# Patient Record
Sex: Female | Born: 1955
Health system: Southern US, Community
[De-identification: ages and names within clinical notes are randomized; demographics above are authoritative.]

## PROBLEM LIST (undated history)

## (undated) DIAGNOSIS — Z720 Tobacco use: Secondary | ICD-10-CM

## (undated) DIAGNOSIS — F17200 Nicotine dependence, unspecified, uncomplicated: Secondary | ICD-10-CM

## (undated) DIAGNOSIS — I10 Essential (primary) hypertension: Secondary | ICD-10-CM

## (undated) DIAGNOSIS — C44321 Squamous cell carcinoma of skin of nose: Secondary | ICD-10-CM

## (undated) DIAGNOSIS — E559 Vitamin D deficiency, unspecified: Secondary | ICD-10-CM

## (undated) DIAGNOSIS — M79609 Pain in unspecified limb: Secondary | ICD-10-CM

## (undated) DIAGNOSIS — R58 Hemorrhage, not elsewhere classified: Secondary | ICD-10-CM

## (undated) HISTORY — DX: Essential (primary) hypertension: I10

## (undated) HISTORY — DX: Vitamin D deficiency, unspecified: E55.9

## (undated) HISTORY — PX: SQUAMOUS CELL CARCINOMA EXCISION: SHX2433

## (undated) HISTORY — DX: Squamous cell carcinoma of skin of nose: C44.321

## (undated) HISTORY — PX: OTHER SURGICAL HISTORY: SHX169

## (undated) HISTORY — DX: Hemorrhage, not elsewhere classified: R58

## (undated) HISTORY — PX: RHINOPLASTY: SUR1284

## (undated) HISTORY — DX: Tobacco use: Z72.0

## (undated) HISTORY — PX: NASAL RECONSTRUCTION: SHX2069

---
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
## 1998-05-03 ENCOUNTER — Other Ambulatory Visit: Admission: RE | Admit: 1998-05-03 | Discharge: 1998-05-03 | Payer: Self-pay | Admitting: Obstetrics and Gynecology

## 1999-06-06 ENCOUNTER — Other Ambulatory Visit: Admission: RE | Admit: 1999-06-06 | Discharge: 1999-06-06 | Payer: Self-pay | Admitting: Obstetrics and Gynecology

## 1999-06-18 HISTORY — PX: COSMETIC SURGERY: SHX468

## 1999-07-09 ENCOUNTER — Encounter (INDEPENDENT_AMBULATORY_CARE_PROVIDER_SITE_OTHER): Payer: Self-pay | Admitting: Specialist

## 1999-07-09 ENCOUNTER — Other Ambulatory Visit: Admission: RE | Admit: 1999-07-09 | Discharge: 1999-07-09 | Payer: Self-pay | Admitting: Obstetrics and Gynecology

## 2001-01-13 ENCOUNTER — Other Ambulatory Visit: Admission: RE | Admit: 2001-01-13 | Discharge: 2001-01-13 | Payer: Self-pay | Admitting: Obstetrics and Gynecology

## 2001-06-17 DIAGNOSIS — R58 Hemorrhage, not elsewhere classified: Secondary | ICD-10-CM

## 2001-06-17 HISTORY — DX: Hemorrhage, not elsewhere classified: R58

## 2001-06-17 LAB — HM SIGMOIDOSCOPY

## 2002-04-13 ENCOUNTER — Other Ambulatory Visit: Admission: RE | Admit: 2002-04-13 | Discharge: 2002-04-13 | Payer: Self-pay | Admitting: Obstetrics and Gynecology

## 2002-06-23 ENCOUNTER — Encounter: Payer: Self-pay | Admitting: Internal Medicine

## 2002-08-11 ENCOUNTER — Encounter: Payer: Self-pay | Admitting: Internal Medicine

## 2003-06-09 ENCOUNTER — Other Ambulatory Visit: Admission: RE | Admit: 2003-06-09 | Discharge: 2003-06-09 | Payer: Self-pay | Admitting: Obstetrics and Gynecology

## 2004-11-02 ENCOUNTER — Ambulatory Visit: Payer: Self-pay | Admitting: Internal Medicine

## 2004-12-10 ENCOUNTER — Ambulatory Visit: Payer: Self-pay | Admitting: Internal Medicine

## 2005-04-16 ENCOUNTER — Other Ambulatory Visit: Admission: RE | Admit: 2005-04-16 | Discharge: 2005-04-16 | Payer: Self-pay | Admitting: Obstetrics and Gynecology

## 2006-09-19 ENCOUNTER — Encounter: Admission: RE | Admit: 2006-09-19 | Discharge: 2006-09-19 | Payer: Self-pay | Admitting: Obstetrics and Gynecology

## 2007-04-06 ENCOUNTER — Ambulatory Visit: Payer: Self-pay | Admitting: Internal Medicine

## 2007-04-27 ENCOUNTER — Ambulatory Visit: Payer: Self-pay | Admitting: Internal Medicine

## 2007-05-01 ENCOUNTER — Encounter: Admission: RE | Admit: 2007-05-01 | Discharge: 2007-05-01 | Payer: Self-pay | Admitting: Internal Medicine

## 2007-05-04 ENCOUNTER — Ambulatory Visit: Payer: Self-pay | Admitting: Internal Medicine

## 2010-05-03 LAB — HM MAMMOGRAPHY: HM Mammogram: NORMAL

## 2010-05-03 LAB — CONVERTED CEMR LAB: Pap Smear: NORMAL

## 2010-05-08 ENCOUNTER — Ambulatory Visit: Payer: Self-pay | Admitting: Internal Medicine

## 2010-05-08 LAB — CONVERTED CEMR LAB
ALT: 25 units/L (ref 0–35)
AST: 22 units/L (ref 0–37)
Albumin: 4.2 g/dL (ref 3.5–5.2)
Alkaline Phosphatase: 72 units/L (ref 39–117)
BUN: 22 mg/dL (ref 6–23)
Basophils Absolute: 0 10*3/uL (ref 0.0–0.1)
Basophils Relative: 0.7 % (ref 0.0–3.0)
Bilirubin Urine: NEGATIVE
Bilirubin, Direct: 0.1 mg/dL (ref 0.0–0.3)
CO2: 29 meq/L (ref 19–32)
Calcium: 9.6 mg/dL (ref 8.4–10.5)
Chloride: 105 meq/L (ref 96–112)
Cholesterol: 178 mg/dL (ref 0–200)
Creatinine, Ser: 0.7 mg/dL (ref 0.4–1.2)
Eosinophils Absolute: 0.2 10*3/uL (ref 0.0–0.7)
Eosinophils Relative: 2.5 % (ref 0.0–5.0)
GFR calc non Af Amer: 92.08 mL/min (ref >60)
Glucose, Bld: 99 mg/dL (ref 70–99)
HCT: 46.4 % — ABNORMAL HIGH (ref 36.0–46.0)
HDL: 48.6 mg/dL (ref >39.00)
Hemoglobin: 16 g/dL — ABNORMAL HIGH (ref 12.0–15.0)
Ketones, ur: NEGATIVE mg/dL
LDL Cholesterol: 108 mg/dL — ABNORMAL HIGH (ref 0–99)
Leukocytes, UA: NEGATIVE
Lymphocytes Relative: 25.4 % (ref 12.0–46.0)
Lymphs Abs: 1.8 10*3/uL (ref 0.7–4.0)
MCHC: 34.5 g/dL (ref 30.0–36.0)
MCV: 96.4 fL (ref 78.0–100.0)
Monocytes Absolute: 0.5 10*3/uL (ref 0.1–1.0)
Monocytes Relative: 7.3 % (ref 3.0–12.0)
Neutro Abs: 4.5 10*3/uL (ref 1.4–7.7)
Neutrophils Relative %: 64.1 % (ref 43.0–77.0)
Nitrite: NEGATIVE
Platelets: 227 10*3/uL (ref 150.0–400.0)
Potassium: 4.6 meq/L (ref 3.5–5.1)
RBC: 4.81 M/uL (ref 3.87–5.11)
RDW: 12.7 % (ref 11.5–14.6)
Sodium: 142 meq/L (ref 135–145)
Specific Gravity, Urine: 1.025 (ref 1.000–1.030)
TSH: 1.45 microintl units/mL (ref 0.35–5.50)
Total Bilirubin: 0.7 mg/dL (ref 0.3–1.2)
Total CHOL/HDL Ratio: 4
Total Protein, Urine: NEGATIVE mg/dL
Total Protein: 6.9 g/dL (ref 6.0–8.3)
Triglycerides: 107 mg/dL (ref 0.0–149.0)
Urine Glucose: NEGATIVE mg/dL
Urobilinogen, UA: 0.2 (ref 0.0–1.0)
VLDL: 21.4 mg/dL (ref 0.0–40.0)
WBC: 7 10*3/uL (ref 4.5–10.5)
pH: 6 (ref 5.0–8.0)

## 2010-05-16 ENCOUNTER — Telehealth: Payer: Self-pay | Admitting: Internal Medicine

## 2010-05-16 ENCOUNTER — Ambulatory Visit: Payer: Self-pay | Admitting: Internal Medicine

## 2010-06-20 ENCOUNTER — Ambulatory Visit
Admission: RE | Admit: 2010-06-20 | Discharge: 2010-06-20 | Payer: Self-pay | Source: Home / Self Care | Attending: Internal Medicine | Admitting: Internal Medicine

## 2010-06-20 DIAGNOSIS — M79609 Pain in unspecified limb: Secondary | ICD-10-CM | POA: Insufficient documentation

## 2010-06-20 HISTORY — DX: Pain in unspecified limb: M79.609

## 2010-07-03 ENCOUNTER — Telehealth: Payer: Self-pay | Admitting: Internal Medicine

## 2010-07-03 ENCOUNTER — Ambulatory Visit
Admission: RE | Admit: 2010-07-03 | Discharge: 2010-07-03 | Payer: Self-pay | Source: Home / Self Care | Attending: Internal Medicine | Admitting: Internal Medicine

## 2010-07-04 ENCOUNTER — Encounter: Payer: Self-pay | Admitting: Internal Medicine

## 2010-07-04 ENCOUNTER — Telehealth: Payer: Self-pay

## 2010-07-19 NOTE — Assessment & Plan Note (Signed)
Summary: consult re: pain in thumb/?hyper extended/cjr   Vital Signs:  Patient profile:   55 year old female Weight:      127 pounds Pulse rate:   72 / minute BP sitting:   150 / 98  (left arm)  Vitals Entered By: Kyung Rudd, CMA (June 20, 2010 11:04 AM) CC: hurt thumb since before Christmas   CC:  hurt thumb since before Christmas.  History of Present Illness: Patient presents to clinic as a workin for evaluation of thumb pain. Note  ~10d h/o right thumb pain worse with movement. No injury/trauma. No joint swelling, erythema or warmth. Distal thumb does sometime pop into extention. H/o CTS in the past and does note intermittent hand/finger paresthesias. Elevated BP reviewed and states has been elevated in the past. Does not have formal h/o HTN and takes no medication on a regular basis.   Current Medications (verified): 1)  Nabumetone 500 Mg  Tabs (Nabumetone) .... Two Times A Day As Needed  Allergies (verified): 1)  ! Codeine Sulfate (Codeine Sulfate)  Review of Systems      See HPI General:  Denies chills, fever, and sweats. MS:  Complains of joint pain; denies joint redness, joint swelling, loss of strength, and cramps. Neuro:  Complains of numbness and tingling; denies brief paralysis and weakness.  Physical Exam  General:  Well-developed,well-nourished,in no acute distress; alert,appropriate and cooperative throughout examination Head:  Normocephalic and atraumatic without obvious abnormalities. No apparent alopecia or balding. Msk:  normal ROM, no joint swelling, no joint warmth, no redness over joints, and no joint deformities.   No pop/click or joint hesitation. +tenderness of dorsal thumb base. +phalens right without thenar muscle wasting.   Impression & Recommendations:  Problem # 1:  THUMB PAIN, RIGHT (ICD-729.5) Assessment New Attempt nsaid course over 1 week with food and no other nsaid. Has relafen at home and will begin. Discussed xray and pt wishes  to pursue empiric tx first. Followup if no improvement or worsening.  Problem # 2:  CARPAL TUNNEL SYNDROME, RIGHT (ICD-354.0) Assessment: Unchanged Attempt nsaid as above. Followup if no improvement or worsening.  Problem # 3:  ELEVATED BP READING WITHOUT DX HYPERTENSION (ICD-796.2) Assessment: Unchanged Possible contribution from hand/thumb pain. Discussed low sodium diet, regular exercise and recommended outpt blood pressure log to be submitted to PMD>  Complete Medication List: 1)  Nabumetone 500 Mg Tabs (Nabumetone) .... Two times a day as needed   Orders Added: 1)  Est. Patient Level IV [16109]

## 2010-07-19 NOTE — Miscellaneous (Signed)
  Clinical Lists Changes  Orders: Added new Referral order of Orthopedic Referral (Ortho) - Signed

## 2010-07-19 NOTE — Progress Notes (Signed)
----   Converted from flag ---- ---- 07/04/2010 11:30 AM, Edwyna Perfect MD wrote: if thumb pain not any better then can refer ortho ------------------------------  Phone Note Outgoing Call   Call placed by: Kyung Rudd, CMA,  July 04, 2010 11:45 AM Call placed to: Patient Summary of Call: pt would like the referral. referral process started Initial call taken by: Kyung Rudd, CMA,  July 04, 2010 11:46 AM

## 2010-07-19 NOTE — Assessment & Plan Note (Signed)
Summary: VERTIGO/MHF   Vital Signs:  Patient Profile:   55 Years Old Female Weight:      125 pounds Temp:     98.2 degrees F oral Pulse rate:   78 / minute Pulse rhythm:   regular BP sitting:   132 / 94  Vitals Entered By: Lynann Beaver CMA (April 06, 2007 11:58 AM)                 Chief Complaint:  vertigo.  History of Present Illness: 3 weeks of intermittent vertigo. she describes spinning sensation with rapid head turning. She has had similar sxs previously (10 years ago--resolved sponatneously)  Current Allergies: No known allergies   Past Medical History:    Reviewed history from 02/20/2007 and no changes required:       L4-L5       hemorrhoidal bleeding  ~2003  Past Surgical History:    Reviewed history from 02/20/2007 and no changes required:       C-section       cosmetic surgery (eyes)     Review of Systems       no other complaints in a complete ROS    Physical Exam  General:     Well-developed,well-nourished,in no acute distress; alert,appropriate and cooperative throughout examination Head:     normocephalic and atraumatic.   Eyes:     pupils equal, pupils round, and no nystagmus.   eom intact Neck:     No deformities, masses, or tenderness noted. Chest Wall:     No deformities, masses, or tenderness noted. Lungs:     Normal respiratory effort, chest expands symmetrically. Lungs are clear to auscultation, no crackles or wheezes.    Impression & Recommendations:  Problem # 1:  VERTIGO (ICD-780.4) no concerning findings---she will call if sxs persist Her updated medication list for this problem includes:    Meclizine Hcl 25 Mg Tabs (Meclizine hcl) .Marland Kitchen... 1 by mouth at bedtime as needed   Complete Medication List: 1)  Nabumetone 500 Mg Tabs (Nabumetone) .... 2 at bedtime prn 2)  Meclizine Hcl 25 Mg Tabs (Meclizine hcl) .Marland Kitchen.. 1 by mouth at bedtime as needed      Prescriptions: MECLIZINE HCL 25 MG TABS (MECLIZINE HCL) 1 by  mouth at bedtime as needed  #20 x 1   Entered and Authorized by:   Birdie Sons MD   Signed by:   Birdie Sons MD on 04/06/2007   Method used:   Electronically sent to ...       [id number]       [phone]       [place], [place]  [address]       Ph: [phone]       Fax: [phone]   RxID:   [id number]  ]

## 2010-07-19 NOTE — Letter (Signed)
Summary: [facility]  [facility]   Imported By: Lanelle Bal 06/08/2010 10:08:50  _____________________________________________________________________  External Attachment:    Type:   Image     Comment:   External Document

## 2010-07-19 NOTE — Progress Notes (Signed)
Summary: xray please  Phone Note Call from Patient Call back at Home Phone [phone]   Caller: Patient Call For: Birdie Sons MD Summary of Call: pt is still having thumb pain she is requesting xray Initial call taken by: Heron Sabins,  July 03, 2010 9:27 AM  Follow-up for Phone Call        xray order placed for right hand and thumb Follow-up by: Edwyna Perfect MD,  July 03, 2010 9:36 AM  Additional Follow-up for Phone Call Additional follow up Details #1::        pt aware Additional Follow-up by: Kyung Rudd, CMA,  July 03, 2010 12:35 PM  New Problems: HAND PAIN, RIGHT (ICD-729.5) HAND PAIN, LEFT (ICD-729.5) THUMB PAIN, RIGHT (ICD-729.5)   New Problems: HAND PAIN, RIGHT (ICD-729.5) HAND PAIN, LEFT (ICD-729.5) THUMB PAIN, RIGHT (ICD-729.5)

## 2010-07-19 NOTE — Assessment & Plan Note (Signed)
Summary: CPX // RS   Vital Signs:  Patient profile:   55 year old female Height:      60 inches Weight:      125 pounds BMI:     24.50 Temp:     98.1 degrees F oral Pulse rate:   72 / minute Pulse rhythm:   regular BP sitting:   138 / 94  (left arm) Cuff size:   regular  Vitals Entered By: Alfred Levins, CMA (May 16, 2010 9:01 AM)  Serial Vital Signs/Assessments:  Time      Position  BP       Pulse  Resp  Temp     By                     128/85                         Birdie Sons MD  CC: cpx, no pap   CC:  cpx and no pap.  History of Present Illness: CPX  in addition she has multiple complaints--- Hip pain---seems to be more right sided---improving with new mattress. Pain typically worse after (not during) walking.  Smoker---she is interested in quitting but not taking any medications  BP--she is concerned about it---has a BP cuff but never checks.   Menopausal sxs---has regular f/u with GYN.  All other systems reviewed and were negative   Current Problems (verified): None  Current Medications (verified): 1)  Nabumetone 500 Mg  Tabs (Nabumetone) .... 2 At Bedtime Prn  Allergies (verified): 1)  ! 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 HEENT exam atraumatic, normocephalic symmetric her muscles are intact. Neck is supple without lymphadenopathy, thyromegaly, jugular venous distention. Chest her auscultation. Cardiac exam S1-S2 are regular. Abdominal exam active bowel sounds, soft and nontender. Extremities no clubbing cyanosis or edema. Neurologic exam she is alert and oriented without any motor or sensory deficits. Gait is normal.   Impression &  Recommendations:  Problem # 1:  HEALTH SCREENING (ICD-V70.0) health maintenance is up-to-date. I'll give her colonoscopy report from gastroenterology. She's up-to-date on other health maintenance issues. Discussed smoking cessation. She's not interested in medications. I've advised her to taper cigarettes. In regards to her complaints. I think she has a persistence of her previously diagnosed lumbar disc problem. I think her hip discomfort is more back related. I'll give her a pamphlet on back exercises.   Complete Medication List: 1)  Nabumetone 500 Mg Tabs (Nabumetone) .... 2 at bedtime prn  Preventive Care Screening  Colonoscopy:    Date:  06/17/2005    Next Due:  06/2015    Results:  normal   Mammogram:    Date:  05/03/2010    Next Due:  05/2012    Results:  normal   Pap Smear:    Date:  05/03/2010    Next Due:  05/2013    Results:  normal     Orders Added: 1)  Est. Patient 40-64 years [99396]    Preventive Care Screening  Colonoscopy:    Date:  06/17/2005    Next Due:  06/2015    Results:  normal   Mammogram:    Date:  05/03/2010    Next Due:  05/2012    Results:  normal   Pap Smear:    Date:  05/03/2010    Next Due:  05/2013    Results:  normal

## 2010-07-19 NOTE — Assessment & Plan Note (Signed)
Summary: STILL HAS VERTIGO/CCM   Vital Signs:  Patient Profile:   55 Years Old Female Weight:      126 pounds Temp:     98.5 degrees F oral Pulse rate:   72 / minute BP sitting:   128 / 84  (left arm)  Vitals Entered By: Gladis Riffle, RN (April 27, 2007 4:34 PM)                 Chief Complaint:  c/o vertigo--improved with Meclizine and but symptoms returned when turned on light.  History of Present Illness: persistent vertigo meclizine no real relief no other symptoms  Current Allergies: ! CODEINE SULFATE (CODEINE SULFATE)  Past Medical History:    Reviewed history from 02/20/2007 and no changes required:       L4-L5       hemorrhoidal bleeding  ~2003  Past Surgical History:    Reviewed history from 02/20/2007 and no changes required:       C-section       cosmetic surgery (eyes)     Review of Systems       no other complaints in a complete ROS    Physical Exam  General:     Well-developed,well-nourished,in no acute distress; alert,appropriate and cooperative throughout examination Head:     normocephalic and atraumatic.   Eyes:     pupils equal, pupils round, and no nystagmus.   Ears:     R ear normal, L ear normal, and no external deformities.   Nose:     no external deformity.   Neck:     No deformities, masses, or tenderness noted. Abdomen:     Bowel sounds positive,abdomen soft and non-tender without masses, organomegaly or hernias noted.    Impression & Recommendations:  Problem # 1:  VERTIGO (ICD-780.4) persistent vertigo---worsening mri brain Her updated medication list for this problem includes:    Meclizine Hcl 25 Mg Tabs (Meclizine hcl) ..... One at bedtime as needed  Orders: Radiology Referral (Radiology)   Complete Medication List: 1)  Nabumetone 500 Mg Tabs (Nabumetone) .... 2 at bedtime prn 2)  Meclizine Hcl 25 Mg Tabs (Meclizine hcl) .... One at bedtime as needed     ]

## 2010-07-19 NOTE — Letter (Signed)
Summary: [facility]  [facility]   Imported By: Lanelle Bal 06/08/2010 10:11:35  _____________________________________________________________________  External Attachment:    Type:   Image     Comment:   External Document

## 2010-07-19 NOTE — Letter (Signed)
[facility]   Imported By: Maryln Gottron 05/31/2010 13:03:40  _____________________________________________________________________  External Attachment:    Type:   Image     Comment:   External Document

## 2010-07-19 NOTE — Progress Notes (Signed)
Summary: clairfy directions  Phone Note From Pharmacy   Caller: [facility]. #[id number]* Call For: Floye Fesler  Summary of Call: rx sent in electronically directions say 2 by mouth at bedtime and two times a day as needed for back pain.  They want to clarify the directions Initial call taken by: Alfred Levins, CMA,  May 16, 2010 5:08 PM  Follow-up for Phone Call        my mistake...change to two times a day as needed   please change in med list Follow-up by: Birdie Sons MD,  May 16, 2010 5:14 PM  Additional Follow-up for Phone Call Additional follow up Details #1::        Phone call completed, Pharmacist called Additional Follow-up by: Alfred Levins, CMA,  May 16, 2010 5:17 PM    New/Updated Medications: NABUMETONE 500 MG  TABS (NABUMETONE) two times a day as needed Prescriptions: NABUMETONE 500 MG  TABS (NABUMETONE) two times a day as needed  #30 x 3   Entered by:   Alfred Levins, CMA   Authorized by:   Birdie Sons MD   Signed by:   Alfred Levins, CMA on 05/16/2010   Method used:   Electronically to        [facility]. [phone]* (retail)       [phone]       [place], [place]  [address]       Ph: [phone]       Fax: [phone]   RxID:   [id number]

## 2010-07-19 NOTE — Assessment & Plan Note (Signed)
Summary: DISCUSS MRI RESULTS/DM   Vital Signs:  Patient Profile:   55 Years Old Female Pulse rate:   84 / minute BP sitting:   158 / 100  (left arm)                 Serial Vital Signs/Assessments:  Time      Position  BP       Pulse  Resp  Temp     By                     110/60                         Birdie Sons MD   Chief Complaint:  discuss MRI results--declines flu shot.  Current Allergies (reviewed today): ! CODEINE SULFATE (CODEINE SULFATE)        Impression & Recommendations:  Problem # 1:  MAGNETIC RESONANCE IMAGING, BRAIN, ABNORMAL (ICD-794.09) Discussed the results at length. It is not clear at all to me that her sxs of vertigo are related to the findings on MRI. Reviewed MRI at length. She understands the possibility of MS. I've told her she needs to see a specialist and she agrees.  Orders: Neurology Referral (Neuro) total time 25 minutes---all discussion  Complete Medication List: 1)  Nabumetone 500 Mg Tabs (Nabumetone) .... 2 at bedtime prn 2)  Meclizine Hcl 25 Mg Tabs (Meclizine hcl) .... One at bedtime as needed     ]

## 2011-03-12 ENCOUNTER — Ambulatory Visit (INDEPENDENT_AMBULATORY_CARE_PROVIDER_SITE_OTHER): Payer: BC Managed Care – PPO | Admitting: Internal Medicine

## 2011-03-12 ENCOUNTER — Encounter: Payer: Self-pay | Admitting: Internal Medicine

## 2011-03-12 VITALS — BP 130/84 | Temp 98.2°F | Wt 126.0 lb

## 2011-03-12 DIAGNOSIS — M79609 Pain in unspecified limb: Secondary | ICD-10-CM

## 2011-03-12 NOTE — Progress Notes (Signed)
  Subjective:    Patient ID: Amanda Moss, female    DOB: 25-Jul-1955, 55 y.o.   MRN: [id number]  HPI  Bilateral wrist pain--base of thumbs Symptoms of ongoing for several months. See previous x-ray results. She continues to have pain. Now she has left thumb discomfort as well. Some of the pain radiates to the wrist and both hands. She denies any redness or effusions.  Past Medical History  Diagnosis Date  . Bleeding 2003    hemorrhoidal bleeding   Past Surgical History  Procedure Date  . Cesarean section   . Cosmectic surgery     eyes  . Rhinoplasty     reports that she has been smoking Cigarettes.  She has been smoking about .5 packs per day. She does not have any smokeless tobacco history on file. She reports that she drinks alcohol. She reports that she does not use illicit drugs. family history includes Cancer (age of onset:62) in her father; Hypertension in her father; and Pneumonia (age of onset:76) in her mother.  There is no history of Diabetes. Allergies  Allergen Reactions  . Codeine Sulfate     REACTION: nausea     Review of Systems    patient denies chest pain, shortness of breath, orthopnea. Denies lower extremity edema, abdominal pain, change in appetite, change in bowel movements. Patient denies rashes, musculoskeletal complaints. No other specific complaints in a complete review of systems.    Objective:   Physical Exam Well-developed female in no acute distress. Neck is supple. Full range of motion of shoulders, elbows, wrists. No effusions noted.       Assessment & Plan:

## 2011-03-12 NOTE — Assessment & Plan Note (Signed)
Given her duration of discomfort I think she needs further evaluation. We'll check laboratory work. She may need to go see a Hydrographic surveyor.

## 2011-03-13 LAB — C-REACTIVE PROTEIN: CRP: 0.14 mg/dL (ref <0.60)

## 2011-03-13 LAB — CYCLIC CITRUL PEPTIDE ANTIBODY, IGG: Cyclic Citrullin Peptide Ab: <2 U/mL (ref 0.0–5.0)

## 2011-03-13 LAB — RHEUMATOID FACTOR: Rhuematoid fact SerPl-aCnc: <10 IU/mL (ref <=14)

## 2011-03-14 ENCOUNTER — Telehealth: Payer: Self-pay | Admitting: *Deleted

## 2011-03-14 DIAGNOSIS — M79641 Pain in right hand: Secondary | ICD-10-CM

## 2011-03-14 NOTE — Telephone Encounter (Signed)
Per Dr Cato Mulligan refer to Dr Teressa Senter for evaluation.  Referral order placed and pt aware

## 2011-03-14 NOTE — Telephone Encounter (Signed)
Gave pt normal lab results, she is still having wrist pain and wants to know the next step

## 2013-07-18 LAB — HM MAMMOGRAPHY

## 2014-01-31 ENCOUNTER — Telehealth: Payer: Self-pay | Admitting: Internal Medicine

## 2014-01-31 NOTE — Telephone Encounter (Signed)
Ok to schedule.

## 2014-01-31 NOTE — Telephone Encounter (Signed)
Pt last saw dr sword 02-2011. Pt needs biometric screening done before 03-16-14. Can I create 30 min slot ?

## 2014-02-01 NOTE — Telephone Encounter (Signed)
Pt has been sch

## 2014-03-02 ENCOUNTER — Ambulatory Visit (INDEPENDENT_AMBULATORY_CARE_PROVIDER_SITE_OTHER): Payer: BC Managed Care – PPO | Admitting: Family

## 2014-03-02 ENCOUNTER — Encounter: Payer: Self-pay | Admitting: Family

## 2014-03-02 VITALS — BP 160/90 | HR 67 | Ht 60.0 in | Wt 134.3 lb

## 2014-03-02 DIAGNOSIS — IMO0001 Reserved for inherently not codable concepts without codable children: Secondary | ICD-10-CM

## 2014-03-02 DIAGNOSIS — Z0000 Encounter for general adult medical examination without abnormal findings: Secondary | ICD-10-CM

## 2014-03-02 DIAGNOSIS — R030 Elevated blood-pressure reading, without diagnosis of hypertension: Secondary | ICD-10-CM

## 2014-03-02 DIAGNOSIS — Z23 Encounter for immunization: Secondary | ICD-10-CM

## 2014-03-02 LAB — COMPREHENSIVE METABOLIC PANEL
ALT: 22 U/L (ref 0–35)
AST: 21 U/L (ref 0–37)
Albumin: 4.3 g/dL (ref 3.5–5.2)
Alkaline Phosphatase: 91 U/L (ref 39–117)
BUN: 19 mg/dL (ref 6–23)
CO2: 27 mEq/L (ref 19–32)
Calcium: 9.6 mg/dL (ref 8.4–10.5)
Chloride: 107 mEq/L (ref 96–112)
Creatinine, Ser: 0.8 mg/dL (ref 0.4–1.2)
GFR: 76.77 mL/min (ref >60.00)
Glucose, Bld: 111 mg/dL — ABNORMAL HIGH (ref 70–99)
Potassium: 4.8 mEq/L (ref 3.5–5.1)
Sodium: 141 mEq/L (ref 135–145)
Total Bilirubin: 0.5 mg/dL (ref 0.2–1.2)
Total Protein: 7.6 g/dL (ref 6.0–8.3)

## 2014-03-02 LAB — CBC WITH DIFFERENTIAL/PLATELET
Basophils Absolute: 0 10*3/uL (ref 0.0–0.1)
Basophils Relative: 0.5 % (ref 0.0–3.0)
Eosinophils Absolute: 0.2 10*3/uL (ref 0.0–0.7)
Eosinophils Relative: 2.4 % (ref 0.0–5.0)
HCT: 47.2 % — ABNORMAL HIGH (ref 36.0–46.0)
Hemoglobin: 15.8 g/dL — ABNORMAL HIGH (ref 12.0–15.0)
Lymphocytes Relative: 22.3 % (ref 12.0–46.0)
Lymphs Abs: 1.5 10*3/uL (ref 0.7–4.0)
MCHC: 33.5 g/dL (ref 30.0–36.0)
MCV: 96 fl (ref 78.0–100.0)
Monocytes Absolute: 0.4 10*3/uL (ref 0.1–1.0)
Monocytes Relative: 6.5 % (ref 3.0–12.0)
Neutro Abs: 4.7 10*3/uL (ref 1.4–7.7)
Neutrophils Relative %: 68.3 % (ref 43.0–77.0)
Platelets: 240 10*3/uL (ref 150.0–400.0)
RBC: 4.92 Mil/uL (ref 3.87–5.11)
RDW: 13.3 % (ref 11.5–15.5)
WBC: 6.9 10*3/uL (ref 4.0–10.5)

## 2014-03-02 LAB — POCT URINALYSIS DIPSTICK
Bilirubin, UA: NEGATIVE
Glucose, UA: NEGATIVE
Ketones, UA: NEGATIVE
Leukocytes, UA: NEGATIVE
Nitrite, UA: NEGATIVE
Protein, UA: NEGATIVE
Spec Grav, UA: 1.025
Urobilinogen, UA: 0.2
pH, UA: 5.5

## 2014-03-02 LAB — LIPID PANEL
Cholesterol: 196 mg/dL (ref 0–200)
HDL: 48.9 mg/dL (ref >39.00)
LDL Cholesterol: 119 mg/dL — ABNORMAL HIGH (ref 0–99)
NonHDL: 147.1
Total CHOL/HDL Ratio: 4
Triglycerides: 140 mg/dL (ref 0.0–149.0)
VLDL: 28 mg/dL (ref 0.0–40.0)

## 2014-03-02 LAB — TSH: TSH: 0.52 u[IU]/mL (ref 0.35–4.50)

## 2014-03-02 NOTE — Progress Notes (Signed)
Pre visit review using our clinic review tool, if applicable. No additional management support is needed unless otherwise documented below in the visit note. 

## 2014-03-02 NOTE — Patient Instructions (Signed)

## 2014-03-02 NOTE — Progress Notes (Signed)
Subjective:    Patient ID: Amanda Moss, female    DOB: 1955-11-26, 58 y.o.   MRN: [id number]  HPI 58 year old white female, nonsmoker, is in today for a CPX.  This is a routine wellness  examination for this patient . I reviewed all health maintenance protocols including mammography, colonoscopy, bone density Needed referrals were placed. Age and diagnosis  appropriate screening labs were ordered. Her immunization history was reviewed and appropriate vaccinations were ordered. Her current medications and allergies were reviewed and needed refills of her chronic medications were ordered. The plan for yearly health maintenance was discussed all orders and referrals were made as appropriate.   Review of Systems  Constitutional: Negative.   HENT: Negative.   Eyes: Negative.   Respiratory: Negative.   Cardiovascular: Negative.   Gastrointestinal: Negative.   Endocrine: Negative.   Genitourinary: Negative.   Musculoskeletal: Negative.   Skin: Negative.   Allergic/Immunologic: Negative.   Neurological: Negative.   Hematological: Negative.   Psychiatric/Behavioral: Negative.    Past Medical History  Diagnosis Date  . Bleeding 2003    hemorrhoidal bleeding    History   Social History  . Marital Status: Divorced    Spouse Name: N/A    Number of Children: 1  . Years of Education: N/A   Occupational History  . Corporate treasurer    Social History Main Topics  . Smoking status: Current Every Day Smoker -- 0.50 packs/day    Types: Cigarettes  . Smokeless tobacco: Not on file  . Alcohol Use: Yes  . Drug Use: No  . Sexual Activity: Not on file   Other Topics Concern  . Not on file   Social History Narrative  . No narrative on file    Past Surgical History  Procedure Laterality Date  . Cesarean section    . Cosmectic surgery      eyes  . Rhinoplasty      Family History  Problem Relation Age of Onset  . Pneumonia Mother 57  . Cancer Father 25    lung  .  Hypertension Father   . Diabetes Neg Hx     Allergies  Allergen Reactions  . Codeine Sulfate     REACTION: nausea    No current outpatient prescriptions on file prior to visit.   No current facility-administered medications on file prior to visit.    BP 160/90  Pulse 67  Ht 5' (1.524 m)  Wt 134 lb 4.8 oz (60.918 kg)  BMI 26.23 kg/m2chart    Objective:   Physical Exam  Constitutional: She is oriented to person, place, and time. She appears well-developed and well-nourished.  HENT:  Head: Normocephalic.  Right Ear: External ear normal.  Left Ear: External ear normal.  Nose: Nose normal.  Mouth/Throat: Oropharynx is clear and moist.  Eyes: Conjunctivae and EOM are normal. Pupils are equal, round, and reactive to light.  Neck: Normal range of motion. Neck supple. No thyromegaly present.  Cardiovascular: Normal rate, regular rhythm and normal heart sounds.   Pulmonary/Chest: Effort normal and breath sounds normal.  Abdominal: Soft. Bowel sounds are normal.  Musculoskeletal: Normal range of motion.  Neurological: She is alert and oriented to person, place, and time. She has normal reflexes.  Skin: Skin is warm and dry.  Psychiatric: She has a normal mood and affect.          Assessment & Plan:  Nettie was seen today for annual exam.  Diagnoses and associated orders for  this visit:  Preventative health care - POCT urinalysis dipstick - CBC with Differential - TSH - Lipid Panel - CMP - Ambulatory referral to Gastroenterology - EKG 12-Lead  Elevated blood pressure - POCT urinalysis dipstick - CBC with Differential - TSH - Lipid Panel - CMP - EKG 12-Lead  Need for prophylactic vaccination with combined diphtheria-tetanus-pertussis (DTP) vaccine - Tdap vaccine greater than or equal to 7yo IM   Call the office with any questions or concerns. Recheck as scheduled in 1 month and as needed. Recheck blood pressure x 2 weeks and send blood pressure readings through  mychart.

## 2014-03-09 ENCOUNTER — Ambulatory Visit: Payer: BC Managed Care – PPO | Admitting: Family

## 2014-04-04 ENCOUNTER — Encounter: Payer: Self-pay | Admitting: Family

## 2014-07-14 ENCOUNTER — Telehealth: Payer: Self-pay | Admitting: Internal Medicine

## 2014-07-14 NOTE — Telephone Encounter (Signed)
Patient Name: Amanda Moss  DOB: April 06, 1956    Initial Comment Caller states she was seen Sept/Oct for high blood pressure. Quit drinking, got healthier, still 154/90.   Nurse Assessment  Nurse: Raphael Gibney, RN, Vanita Ingles Date/Time Eilene Ghazi Time): 07/14/2014 12:31:54 PM  Confirm and document reason for call. If symptomatic, describe symptoms. ---Caller states she saw doctor back in Sept/Oct for check up and her BP was high. She is taking exercise and has lost exercise. BP 154/90. She is not taking any BP medication.  Has the patient traveled out of the country within the last 30 days? ---Not Applicable  Does the patient require triage? ---Yes  Related visit to physician within the last 2 weeks? ---No  Does the PT have any chronic conditions? (i.e. diabetes, asthma, etc.) ---No     Guidelines    Guideline Title Affirmed Question Affirmed Notes  High Blood Pressure [1] BP ? 140/90 AND [2] not taking BP medications    Final Disposition User   See PCP within 2 Maudry Diego, Therapist, sports, Vanita Ingles

## 2014-07-19 ENCOUNTER — Encounter: Payer: Self-pay | Admitting: Family Medicine

## 2014-07-19 ENCOUNTER — Ambulatory Visit (INDEPENDENT_AMBULATORY_CARE_PROVIDER_SITE_OTHER): Payer: BLUE CROSS/BLUE SHIELD | Admitting: Family Medicine

## 2014-07-19 VITALS — BP 158/90 | HR 81 | Temp 97.6°F | Ht 60.0 in | Wt 132.6 lb

## 2014-07-19 DIAGNOSIS — I10 Essential (primary) hypertension: Secondary | ICD-10-CM

## 2014-07-19 DIAGNOSIS — F17200 Nicotine dependence, unspecified, uncomplicated: Secondary | ICD-10-CM

## 2014-07-19 DIAGNOSIS — Z720 Tobacco use: Secondary | ICD-10-CM

## 2014-07-19 MED ORDER — AMLODIPINE BESYLATE 5 MG PO TABS: 5.0000 mg | ORAL_TABLET | Freq: Every day | ORAL | Status: DC

## 2014-07-19 NOTE — Progress Notes (Signed)
Pre visit review using our clinic review tool, if applicable. No additional management support is needed unless otherwise documented below in the visit note. 

## 2014-07-19 NOTE — Progress Notes (Signed)
  HPI:  Amanda Moss is a 59 yo female prior patient of Dr. Leanne Moss and Amanda Moss here for an acute visit for:  HTN: -hx elevated BP for several years at physicals with gyn and watches it at home -not on medications in the past -more elevated the last year -smoker -watching carbs and exercising recently -denies: CP, vision loss, HA, SOB -FH HTN   ROS: See pertinent positives and negatives per HPI.  Past Medical History  Diagnosis Date  . Bleeding 2003    hemorrhoidal bleeding    Past Surgical History  Procedure Laterality Date  . Cesarean section    . Cosmectic surgery      eyes  . Rhinoplasty      Family History  Problem Relation Age of Onset  . Pneumonia Mother 63  . Cancer Father 23    lung  . Hypertension Father   . Diabetes Neg Hx     History   Social History  . Marital Status: Divorced    Spouse Name: N/A    Number of Children: 1  . Years of Education: N/A   Occupational History  . Corporate treasurer    Social History Main Topics  . Smoking status: Current Every Day Smoker -- 0.50 packs/day    Types: Cigarettes  . Smokeless tobacco: None  . Alcohol Use: Yes  . Drug Use: No  . Sexual Activity: None   Other Topics Concern  . None   Social History Narrative     Current outpatient prescriptions:  .  amLODipine (NORVASC) 5 MG tablet, Take 1 tablet (5 mg total) by mouth daily., Disp: 30 tablet, Rfl: 1  EXAM:  Filed Vitals:   07/19/14 1042  BP: 158/90  Pulse: 81  Temp: 97.6 F (36.4 C)    Body mass index is 25.9 kg/(m^2).  GENERAL: vitals reviewed and listed above, alert, oriented, appears well hydrated and in no acute distress  HEENT: atraumatic, conjunttiva clear, no obvious abnormalities on inspection of external nose and ears  NECK: no obvious masses on inspection  LUNGS: clear to auscultation bilaterally, no wheezes, rales or rhonchi, good air movement  CV: HRRR, no peripheral edema  MS: moves all extremities without  noticeable abnormality  PSYCH: pleasant and cooperative, no obvious depression or anxiety  ASSESSMENT AND PLAN:  Discussed the following assessment and plan:  Essential hypertension - Plan: amLODipine (NORVASC) 5 MG tablet  Tobacco use disorder  -discussed options for HTN treatment - she opted for norvasc -advised needs PCP and to set this up -follow up with me in  1 month if does not have PCP -Patient advised to return or notify a doctor immediately if symptoms worsen or persist or new concerns arise.  Patient Instructions  BEFORE YOU LEAVE: -schedule follow up in 1 month  Start norvasc 5mg  daily  You need a new primary care doctor - please establish care with a new primary care doctor  Please quit smoking  We recommend the following healthy lifestyle measures: - eat a healthy diet consisting of lots of vegetables, fruits, beans, nuts, seeds, healthy meats such as white chicken and fish and whole grains.  - avoid fried foods, fast food, processed foods, sodas, red meet and other fattening foods.  - get a least 150 minutes of aerobic exercise per week.         Amanda Moss R.

## 2014-07-19 NOTE — Patient Instructions (Signed)
BEFORE YOU LEAVE: -schedule follow up in 1 month  Start norvasc 5mg  daily  You need a new primary care doctor - please establish care with a new primary care doctor  Please quit smoking  We recommend the following healthy lifestyle measures: - eat a healthy diet consisting of lots of vegetables, fruits, beans, nuts, seeds, healthy meats such as white chicken and fish and whole grains.  - avoid fried foods, fast food, processed foods, sodas, red meet and other fattening foods.  - get a least 150 minutes of aerobic exercise per week.

## 2014-07-20 ENCOUNTER — Telehealth: Payer: Self-pay | Admitting: Internal Medicine

## 2014-07-20 NOTE — Telephone Encounter (Signed)
emmi emailed °

## 2014-07-26 NOTE — Telephone Encounter (Signed)
Pt has been scheduled for 09/2014

## 2014-08-17 ENCOUNTER — Encounter: Payer: Self-pay | Admitting: Family Medicine

## 2014-08-17 ENCOUNTER — Ambulatory Visit (INDEPENDENT_AMBULATORY_CARE_PROVIDER_SITE_OTHER): Payer: BLUE CROSS/BLUE SHIELD | Admitting: Family Medicine

## 2014-08-17 ENCOUNTER — Telehealth: Payer: Self-pay | Admitting: Internal Medicine

## 2014-08-17 VITALS — BP 132/88 | HR 84 | Temp 97.7°F | Ht 60.0 in | Wt 129.2 lb

## 2014-08-17 DIAGNOSIS — E785 Hyperlipidemia, unspecified: Secondary | ICD-10-CM

## 2014-08-17 DIAGNOSIS — Z720 Tobacco use: Secondary | ICD-10-CM

## 2014-08-17 DIAGNOSIS — R739 Hyperglycemia, unspecified: Secondary | ICD-10-CM

## 2014-08-17 DIAGNOSIS — F17200 Nicotine dependence, unspecified, uncomplicated: Secondary | ICD-10-CM

## 2014-08-17 DIAGNOSIS — I10 Essential (primary) hypertension: Secondary | ICD-10-CM

## 2014-08-17 DIAGNOSIS — Z716 Tobacco abuse counseling: Secondary | ICD-10-CM

## 2014-08-17 MED ORDER — BUPROPION HCL ER (SR) 150 MG PO TB12: 150.0000 mg | ORAL_TABLET | Freq: Every day | ORAL | Status: DC

## 2014-08-17 NOTE — Telephone Encounter (Signed)
PA for bupropion was denied.  Patient's plan does not approve bupropion for tobacco disorder.

## 2014-08-17 NOTE — Progress Notes (Signed)
Pre visit review using our clinic review tool, if applicable. No additional management support is needed unless otherwise documented below in the visit note. 

## 2014-08-17 NOTE — Progress Notes (Signed)
HPI:  Amanda Moss is a 59 yo female prior patient of Dr. Leanne Chang and Roxy Cedar here for f/u HTN:  HTN: -hx elevated BP for several years at physicals with gyn and watches it at home -not on medications in the past, started norvasc 5 mg 07/19/14 -smoker - advised to quit -watching carbs and exercising recently -denies: CP, vision loss, HA, SOB -FH HTN  Smoker: -she did chantix in the past and did not tolerate -she smokes 1/2 ppd but has cut back the last few days -she like the physical part of smoking -denies COPD, cough, weight loss  ROS: See pertinent positives and negatives per HPI.  Past Medical History  Diagnosis Date  . Bleeding 2003    hemorrhoidal bleeding    Past Surgical History  Procedure Laterality Date  . Cesarean section    . Cosmectic surgery      eyes  . Rhinoplasty      Family History  Problem Relation Age of Onset  . Pneumonia Mother 15  . Cancer Father 36    lung  . Hypertension Father   . Diabetes Neg Hx     History   Social History  . Marital Status: Divorced    Spouse Name: N/A  . Number of Children: 1  . Years of Education: N/A   Occupational History  . Corporate treasurer    Social History Main Topics  . Smoking status: Current Every Day Smoker -- 0.50 packs/day    Types: Cigarettes  . Smokeless tobacco: Not on file  . Alcohol Use: Yes  . Drug Use: No  . Sexual Activity: Not on file   Other Topics Concern  . None   Social History Narrative     Current outpatient prescriptions:  .  amLODipine (NORVASC) 5 MG tablet, Take 1 tablet (5 mg total) by mouth daily., Disp: 30 tablet, Rfl: 1 .  buPROPion (WELLBUTRIN SR) 150 MG 12 hr tablet, Take 1 tablet (150 mg total) by mouth daily., Disp: 90 tablet, Rfl: 0  EXAM:  Filed Vitals:   08/17/14 0853  BP: 132/88  Pulse: 84  Temp: 97.7 F (36.5 C)    Body mass index is 25.23 kg/(m^2).  GENERAL: vitals reviewed and listed above, alert, oriented, appears well hydrated and  in no acute distress  HEENT: atraumatic, conjunttiva clear, no obvious abnormalities on inspection of external nose and ears  NECK: no obvious masses on inspection  LUNGS: clear to auscultation bilaterally, no wheezes, rales or rhonchi, good air movement  CV: HRRR, no peripheral edema  MS: moves all extremities without noticeable abnormality  PSYCH: pleasant and cooperative, no obvious depression or anxiety  ASSESSMENT AND PLAN:  Discussed the following assessment and plan:  Tobacco use disorder - Plan: buPROPion (WELLBUTRIN SR) 150 MG 12 hr tablet  Essential hypertension - Plan: Basic metabolic panel  Hyperlipemia - Plan: Lipid Panel  Hyperglycemia - Plan: Hemoglobin A1c  Tobacco abuse counseling  -CPE at establish care visit - needs pap - she reports does this with gyn -advised to schedule mammo - she does with gyn -advised colon ca screening - she reports she will do this, wants number to call - provided -smoking cessation counseling for 5 minutes, opted to try wellbutrin -she wants to do labs ahead of NPV in 1 month  -Patient advised to return or notify a doctor immediately if symptoms worsen or persist or new concerns arise.  Patient Instructions  Quit smoking -start the wellbutrin today -quit date in  1 week -think of activity to do when you have cravings  Continue to exercise and eat a healthy diet  Please schedule your colonoscopy [facility] - Address: [address] [place], [place], [place] [address]  [phone]        Amanda Moss.

## 2014-08-17 NOTE — Patient Instructions (Addendum)
Quit smoking -start the wellbutrin today -quit date in 1 week -think of activity to do when you have cravings  Continue to exercise and eat a healthy diet  Please schedule your colonoscopy [facility] - Address: [address] [place], [place], [place] [address]  [phone]

## 2014-08-18 ENCOUNTER — Ambulatory Visit: Payer: BLUE CROSS/BLUE SHIELD | Admitting: Family Medicine

## 2014-08-18 NOTE — Telephone Encounter (Signed)
Please ensure she has printed Rx. Advise she compare cost at [facility], [facility], etc.and give her [facility] info. She will only need this medication for 3 months - even paying out of pocket will be much cheaper then cigarettes in the long run.

## 2014-08-18 NOTE — Telephone Encounter (Signed)
Left a message for the pt to return my call.  

## 2014-08-26 NOTE — Telephone Encounter (Signed)
Patient informed. 

## 2014-09-11 ENCOUNTER — Other Ambulatory Visit: Payer: Self-pay | Admitting: Family Medicine

## 2014-09-21 ENCOUNTER — Ambulatory Visit (INDEPENDENT_AMBULATORY_CARE_PROVIDER_SITE_OTHER): Payer: BLUE CROSS/BLUE SHIELD | Admitting: Family Medicine

## 2014-09-21 ENCOUNTER — Encounter: Payer: Self-pay | Admitting: Family Medicine

## 2014-09-21 VITALS — BP 130/90 | HR 97 | Temp 97.6°F | Ht 60.0 in | Wt 127.9 lb

## 2014-09-21 DIAGNOSIS — Z720 Tobacco use: Secondary | ICD-10-CM

## 2014-09-21 DIAGNOSIS — Z0000 Encounter for general adult medical examination without abnormal findings: Secondary | ICD-10-CM | POA: Diagnosis not present

## 2014-09-21 DIAGNOSIS — I10 Essential (primary) hypertension: Secondary | ICD-10-CM

## 2014-09-21 DIAGNOSIS — Z23 Encounter for immunization: Secondary | ICD-10-CM

## 2014-09-21 DIAGNOSIS — Z7189 Other specified counseling: Secondary | ICD-10-CM

## 2014-09-21 DIAGNOSIS — Z7689 Persons encountering health services in other specified circumstances: Secondary | ICD-10-CM

## 2014-09-21 DIAGNOSIS — E785 Hyperlipidemia, unspecified: Secondary | ICD-10-CM

## 2014-09-21 DIAGNOSIS — F17200 Nicotine dependence, unspecified, uncomplicated: Secondary | ICD-10-CM

## 2014-09-21 HISTORY — DX: Nicotine dependence, unspecified, uncomplicated: F17.200

## 2014-09-21 NOTE — Patient Instructions (Addendum)
BEFORE YOU LEAVE: -schedule a FASTING lab appointment in the next few weeks in the morning - drink plenty of water -follow up in 3-4 months -shingles -waist circumference  See your gynecologist for the pap and the mammogram  Quit smoking - call quitline  Call to schedule your colonoscopy: Please schedule your colonoscopy [facility] - Address: [place], [place], [place] [address]  [phone]   Sun protection  We recommend the following healthy lifestyle measures: - eat a healthy diet consisting of lots of vegetables, fruits, beans, nuts, seeds, healthy meats such as white chicken and fish and whole grains.  - avoid fried foods, fast food, processed foods, sodas, red meet and other fattening foods.  - get a least 150 minutes of aerobic exercise per week.

## 2014-09-21 NOTE — Addendum Note (Signed)
Addended by: Lucretia Kern on: 09/21/2014 12:25 PM   Modules accepted: Orders, Level of Service

## 2014-09-21 NOTE — Addendum Note (Signed)
Addended by: Agnes Lawrence on: 09/21/2014 12:17 PM   Modules accepted: Orders

## 2014-09-21 NOTE — Progress Notes (Signed)
Pre visit review using our clinic review tool, if applicable. No additional management support is needed unless otherwise documented below in the visit note. 

## 2014-09-21 NOTE — Progress Notes (Addendum)
HPI:  Amanda Moss is here to establish care. She is set up to do her pap and breast exam with her gynecologist - she did schedule this and her mammogram. She has a form to complete for work. Not fasting today. She plans to set up her colonoscopy, but has not done this yet.  Has the following chronic problems that require follow up and concerns today:  HTN: -taking norvasc daily -denies: CP, sob, doe -she enjoys smoking when stressed and this makes it difficult to quit  Tobacco Use: -she has not quit yet but plans to quit now that she is back from vacation -taking the wellbutrin in preparation -has smoking 1/2-1 PPD for 40 years -she declined lung cancer screening - advised to call if change she mind -denies: cough, wheeze, sob, hemoptysis  ROS negative for unless reported above: fevers, unintentional weight loss, hearing or vision loss, chest pain, palpitations, struggling to breath, hemoptysis, melena, hematochezia, hematuria, falls, loc, si, thoughts of self harm  Past Medical History  Diagnosis Date  . Bleeding 2003    hemorrhoidal bleeding  . Hypertension   . Tobacco use     Past Surgical History  Procedure Laterality Date  . Cesarean section    . Cosmectic surgery      eyes  . Rhinoplasty      Family History  Problem Relation Age of Onset  . Pneumonia Mother 37  . Cancer Father 36    lung  . Hypertension Father   . Diabetes Neg Hx     History   Social History  . Marital Status: Divorced    Spouse Name: N/A  . Number of Children: 1  . Years of Education: N/A   Occupational History  . Corporate treasurer    Social History Main Topics  . Smoking status: Current Every Day Smoker -- 0.50 packs/day    Types: Cigarettes  . Smokeless tobacco: Not on file  . Alcohol Use: Yes     Comment: 2-3 drinks a few days per week  . Drug Use: No  . Sexual Activity: Not on file   Other Topics Concern  . 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  amLODipine (NORVASC) 5 MG tablet, take 1 tablet by mouth once daily, Disp: 30 tablet, Rfl: 1 .  buPROPion (WELLBUTRIN SR) 150 MG 12 hr tablet, Take 1 tablet (150 mg total) by mouth daily., Disp: 90 tablet, Rfl: 0  EXAM:  Filed Vitals:   09/21/14 1120  BP: 130/90  Pulse: 97  Temp: 97.6 F (36.4 C)    Body mass index is 24.98 kg/(m^2).  GENERAL: vitals reviewed and listed above, alert, oriented, appears well hydrated and in no acute distress  HEENT: atraumatic, conjunttiva clear, no obvious abnormalities on inspection of external nose and ears  NECK: no obvious masses on inspection  LUNGS: clear to auscultation bilaterally, no wheezes, rales or rhonchi, good air movement  CV: HRRR, no peripheral edema  GU: declined  BREAST: declined  SKIN: several SKs scattered on exposed areas, she declined full skin exam  MS: moves all extremities without noticeable abnormality  PSYCH: pleasant and cooperative,  no obvious depression or anxiety  ASSESSMENT AND PLAN:  Discussed the following assessment and plan:  Encounter to establish care with new doctor  Essential hypertension - Plan: Basic metabolic panel  Tobacco use disorder  Need for shingles vaccine - Plan: Varicella-zoster vaccine subcutaneous  Hyperlipemia - Plan: Lipid Panel  -she does breast and gyn exams with her gynecologist -advised again to schedule her colonoscopy and info provided -advised lung ca screening - declined -advised FASTING labs - she agrees to schedule -shingles vaccine -advised to quit smoking and counseled around 5 minutes -We reviewed the PMH, PSH, FH, SH, Meds and Allergies. -We provided refills for any medications we will prescribe as needed. -We addressed current concerns  per orders and patient instructions. -We have asked for records for pertinent exams, studies, vaccines and notes from previous providers. -We have advised patient to follow up per instructions below.   -Patient advised to return or notify a doctor immediately if symptoms worsen or persist or new concerns arise.  Patient Instructions  BEFORE YOU LEAVE: -schedule a FASTING lab appointment in the next few weeks in the morning - drink plenty of water -follow up in 3-4 months -shingles -waist circumference  See your gynecologist for the pap and the mammogram  Quit smoking - call quitline  Call to schedule your colonoscopy: Please schedule your colonoscopy [facility] - Address: [place], [place], [place] [address]  [phone]   Sun protection  We recommend the following healthy lifestyle measures: - eat a healthy diet consisting of lots of vegetables, fruits, beans, nuts, seeds, healthy meats such as white chicken and fish and whole grains.  - avoid fried foods, fast food, processed foods, sodas, red meet and other fattening foods.  - get a least 150 minutes of aerobic exercise per week.         Colin Benton R.

## 2014-10-05 ENCOUNTER — Other Ambulatory Visit (INDEPENDENT_AMBULATORY_CARE_PROVIDER_SITE_OTHER): Payer: BLUE CROSS/BLUE SHIELD

## 2014-10-05 DIAGNOSIS — E785 Hyperlipidemia, unspecified: Secondary | ICD-10-CM | POA: Diagnosis not present

## 2014-10-05 DIAGNOSIS — I10 Essential (primary) hypertension: Secondary | ICD-10-CM

## 2014-10-05 DIAGNOSIS — R739 Hyperglycemia, unspecified: Secondary | ICD-10-CM | POA: Diagnosis not present

## 2014-10-05 LAB — BASIC METABOLIC PANEL
BUN: 16 mg/dL (ref 6–23)
CO2: 27 mEq/L (ref 19–32)
Calcium: 9.8 mg/dL (ref 8.4–10.5)
Chloride: 103 mEq/L (ref 96–112)
Creatinine, Ser: 0.73 mg/dL (ref 0.40–1.20)
GFR: 86.38 mL/min (ref >60.00)
Glucose, Bld: 91 mg/dL (ref 70–99)
Potassium: 4.5 mEq/L (ref 3.5–5.1)
Sodium: 136 mEq/L (ref 135–145)

## 2014-10-05 LAB — LIPID PANEL
Cholesterol: 178 mg/dL (ref 0–200)
HDL: 54.9 mg/dL (ref >39.00)
LDL Cholesterol: 97 mg/dL (ref 0–99)
NonHDL: 123.1
Total CHOL/HDL Ratio: 3
Triglycerides: 129 mg/dL (ref 0.0–149.0)
VLDL: 25.8 mg/dL (ref 0.0–40.0)

## 2014-10-05 LAB — HEMOGLOBIN A1C: Hgb A1c MFr Bld: 5.5 % (ref 4.6–6.5)

## 2014-10-17 ENCOUNTER — Telehealth: Payer: Self-pay | Admitting: Family Medicine

## 2014-10-17 NOTE — Telephone Encounter (Signed)
Pt called to ask if ask if her paperwork was ready for pu. Would like a call back

## 2014-10-18 NOTE — Telephone Encounter (Signed)
I called the pt and apologized to her for not completing the form sooner as I did not see the results since Dr Maudie Mercury sent these through her Mychart acct.  The form was completed and left at the front desk for the pt to pick up.

## 2014-11-07 ENCOUNTER — Encounter: Payer: Self-pay | Admitting: *Deleted

## 2014-11-23 ENCOUNTER — Other Ambulatory Visit: Payer: Self-pay | Admitting: Family Medicine

## 2014-12-28 ENCOUNTER — Other Ambulatory Visit: Payer: Self-pay | Admitting: Family Medicine

## 2015-01-02 NOTE — Telephone Encounter (Signed)
Refilled to appt.

## 2015-01-12 ENCOUNTER — Encounter: Payer: Self-pay | Admitting: Cardiology

## 2015-01-23 ENCOUNTER — Ambulatory Visit: Payer: BLUE CROSS/BLUE SHIELD | Admitting: Family Medicine

## 2015-01-30 ENCOUNTER — Encounter: Payer: Self-pay | Admitting: Family Medicine

## 2015-01-30 ENCOUNTER — Ambulatory Visit (INDEPENDENT_AMBULATORY_CARE_PROVIDER_SITE_OTHER): Payer: BLUE CROSS/BLUE SHIELD | Admitting: Family Medicine

## 2015-01-30 VITALS — BP 130/78 | HR 83 | Temp 98.6°F | Ht 60.0 in | Wt 135.2 lb

## 2015-01-30 DIAGNOSIS — F17200 Nicotine dependence, unspecified, uncomplicated: Secondary | ICD-10-CM

## 2015-01-30 DIAGNOSIS — Z720 Tobacco use: Secondary | ICD-10-CM | POA: Diagnosis not present

## 2015-01-30 DIAGNOSIS — I10 Essential (primary) hypertension: Secondary | ICD-10-CM | POA: Diagnosis not present

## 2015-01-30 MED ORDER — BUPROPION HCL ER (SR) 150 MG PO TB12: 150.0000 mg | ORAL_TABLET | Freq: Every day | ORAL | Status: DC

## 2015-01-30 MED ORDER — AMLODIPINE BESYLATE 5 MG PO TABS: 5.0000 mg | ORAL_TABLET | Freq: Every day | ORAL | Status: DC

## 2015-01-30 NOTE — Patient Instructions (Addendum)
BEFORE YOU LEAVE: -follow up in 4-6 months  Congratulations on quitting smoking!!!!!  Call to schedule your colonoscopy: [facility] - Address: [place], [place], [place] [address]  [phone]  We recommend the following healthy lifestyle measures: - eat a healthy diet consisting of lots of vegetables, fruits, beans, nuts, seeds, healthy meats such as white chicken and fish  - avoid fried foods, starches, sweets fast food, processed foods, sodas, red meet and other fattening foods.  - get a least 150 minutes of aerobic exercise per week.

## 2015-01-30 NOTE — Progress Notes (Signed)
Pre visit review using our clinic review tool, if applicable. No additional management support is needed unless otherwise documented below in the visit note. 

## 2015-01-30 NOTE — Progress Notes (Addendum)
HPI:  Amanda Moss is here for follow up of several issues:  HTN: -taking norvasc daily -denies: CP, sob, doe -denies: CP, SOB  Tobacco Use: -she quit 3 weeks ago -taking the wellbutrin  -has smoking 1/2-1 PPD for 40 years -she declined lung cancer screening - advised to call if change she mind; declined again -denies: cough, wheeze, sob, hemoptysis  ROS negative for unless reported above: fevers, unintentional weight loss, hearing or vision loss, chest pain, palpitations, struggling to breath, hemoptysis, melena, hematochezia, hematuria, falls, loc, si, thoughts of self harm  HM: -colonoscopy: she reports she will call to schedule this, reports was not a good time -Hep c screening: declined today, reports will consider with next physical  ROS: See pertinent positives and negatives per HPI.  Past Medical History  Diagnosis Date  . Bleeding 2003    hemorrhoidal bleeding  . Hypertension   . Tobacco use     Past Surgical History  Procedure Laterality Date  . Cesarean section    . Cosmectic surgery      eyes  . Rhinoplasty      Family History  Problem Relation Age of Onset  . Pneumonia Mother 32  . Cancer Father 48    lung  . Hypertension Father   . Diabetes Neg Hx     Social History   Social History  . Marital Status: Divorced    Spouse Name: N/A  . Number of Children: 1  . Years of Education: N/A   Occupational History  . Corporate treasurer    Social History Main Topics  . Smoking status: Former Smoker -- 0.50 packs/day    Types: Cigarettes  . Smokeless tobacco: None  . Alcohol Use: 0.0 oz/week    0 Standard drinks or equivalent per week     Comment: 2-3 drinks a few days per week  . Drug Use: No  . Sexual Activity: Not Asked   Other Topics Concern  . 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  amLODipine (NORVASC) 5 MG tablet, Take 1 tablet (5 mg total) by mouth daily., Disp: 90 tablet, Rfl: 3 .  buPROPion (WELLBUTRIN SR) 150 MG 12 hr tablet, Take 1 tablet (150 mg total) by mouth daily., Disp: 90 tablet, Rfl: 0  EXAM:  Filed Vitals:   01/30/15 1303  BP: 130/78  Pulse: 83  Temp: 98.6 F (37 C)    Body mass index is 26.4 kg/(m^2).  GENERAL: vitals reviewed and listed above, alert, oriented, appears well hydrated and in no acute distress  HEENT: atraumatic, conjunttiva clear, no obvious abnormalities on inspection of external nose and ears  NECK: no obvious masses on inspection  LUNGS: clear to auscultation bilaterally, no wheezes, rales or rhonchi, good air movement  CV: HRRR, no peripheral edema  MS: moves all extremities without noticeable abnormality  PSYCH: pleasant and cooperative, no obvious depression or anxiety  ASSESSMENT AND PLAN:  Discussed the following assessment and plan:  Essential hypertension  Tobacco use disorder  -congratulated and counseled on smoking cessation -discussed importance of healthy lifestyle -offered and advised lung ca screening, declined -advised to schedule colonoscopy -advised to get flu  shot and notified her of our flu shot clinic -Patient advised to return or notify a doctor immediately if symptoms worsen or persist or new concerns arise.  Patient Instructions  BEFORE YOU LEAVE: -follow up in 4-6 months  Congratulations on quitting smoking!!!!!  Call to schedule your colonoscopy: [facility] - Address: [place], [place], [place] [address]  [phone]  We recommend the following healthy lifestyle measures: - eat a healthy diet consisting of lots of vegetables, fruits, beans, nuts, seeds, healthy meats such as white chicken and fish  - avoid fried foods, starches, sweets fast food, processed foods,  sodas, red meet and other fattening foods.  - get a least 150 minutes of aerobic exercise per week.        Colin Benton R.

## 2015-01-31 ENCOUNTER — Encounter: Payer: Self-pay | Admitting: Gastroenterology

## 2015-04-05 ENCOUNTER — Encounter: Payer: BLUE CROSS/BLUE SHIELD | Admitting: Gastroenterology

## 2015-04-06 LAB — HM COLONOSCOPY

## 2015-04-26 ENCOUNTER — Encounter: Payer: Self-pay | Admitting: Family Medicine

## 2015-08-02 ENCOUNTER — Ambulatory Visit (INDEPENDENT_AMBULATORY_CARE_PROVIDER_SITE_OTHER): Payer: BLUE CROSS/BLUE SHIELD | Admitting: Family Medicine

## 2015-08-02 ENCOUNTER — Encounter: Payer: Self-pay | Admitting: Family Medicine

## 2015-08-02 VITALS — BP 142/90 | HR 97 | Temp 97.9°F | Ht 60.0 in | Wt 132.3 lb

## 2015-08-02 DIAGNOSIS — I10 Essential (primary) hypertension: Secondary | ICD-10-CM

## 2015-08-02 DIAGNOSIS — F17200 Nicotine dependence, unspecified, uncomplicated: Secondary | ICD-10-CM

## 2015-08-02 DIAGNOSIS — L600 Ingrowing nail: Secondary | ICD-10-CM | POA: Diagnosis not present

## 2015-08-02 DIAGNOSIS — L03012 Cellulitis of left finger: Secondary | ICD-10-CM

## 2015-08-02 MED ORDER — AMLODIPINE BESYLATE 5 MG PO TABS: 7.5000 mg | ORAL_TABLET | Freq: Every day | ORAL | Status: DC

## 2015-08-02 NOTE — Progress Notes (Signed)
Pre visit review using our clinic review tool, if applicable. No additional management support is needed unless otherwise documented below in the visit note. 

## 2015-08-02 NOTE — Patient Instructions (Signed)
BEFORE YOU LEAVE: -schedule follow up in 1 month to recheck blood pressure - COME FASTING as will plan to do labs that day as well - please bring for for work program  Increase the norvasce to 1.5 tablets (7.5mg ) daily  Please call your gynecologist today to schedule your mammogram  Soak the toe in warm salty or soapy water twice daily then dry thoroughly and apply antibiotic ointment We placed a referral for you as discussed to the podiatrist. It usually takes about 1-2 weeks to process and schedule this referral. If you have not heard from [place] regarding this appointment in 2 weeks please contact our office.   We recommend the following healthy lifestyle measures: - eat a healthy whole foods diet consisting of regular small meals composed of vegetables, fruits, beans, nuts, seeds, healthy meats such as white chicken and fish and whole grains.  - avoid sweets, white starchy foods, fried foods, fast food, processed foods, sodas, red meet and other fattening foods.  - get a least 150-300 minutes of aerobic exercise per week.

## 2015-08-02 NOTE — Progress Notes (Signed)
HPI:  HTN: -taking norvasc daily -denies: CP, sob, doe  Hx of Tobacco Use: -she quit 12/2014 -1/2-1 PPD for 40 years -she declined lung cancer screening - advised to call if change she mind; declined again -denies: cough, wheeze, sob, hemoptysis  L great toenail issue: -intermittent -some pain at times -wants to see a podiatrist -denies: drainage, fevers, malaise  Mammogram due - offered to refer her but she reports does in her gyn office and will schedule  ROS: See pertinent positives and negatives per HPI.  Past Medical History  Diagnosis Date  . Bleeding 2003    hemorrhoidal bleeding  . Hypertension   . Tobacco use     Past Surgical History  Procedure Laterality Date  . Cesarean section    . Cosmectic surgery      eyes  . Rhinoplasty      Family History  Problem Relation Age of Onset  . Pneumonia Mother 78  . Cancer Father 83    lung  . Hypertension Father   . Diabetes Neg Hx     Social History   Social History  . Marital Status: Divorced    Spouse Name: N/A  . Number of Children: 1  . Years of Education: N/A   Occupational History  . Corporate treasurer    Social History Main Topics  . Smoking status: Former Smoker -- 0.50 packs/day    Types: Cigarettes  . Smokeless tobacco: None  . Alcohol Use: 0.0 oz/week    0 Standard drinks or equivalent per week     Comment: 2-3 drinks a few days per week  . Drug Use: No  . Sexual Activity: Not Asked   Other Topics Concern  . 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  amLODipine (NORVASC) 5 MG tablet, Take 1.5 tablets (7.5 mg total) by mouth daily., Disp: 45 tablet, Rfl: 3  EXAM:  Filed Vitals:   08/02/15 0818  BP: 142/90  Pulse: 97  Temp: 97.9 F (36.6 C)     Body mass index is 25.84 kg/(m^2).  GENERAL: vitals reviewed and listed above, alert, oriented, appears well hydrated and in no acute distress  HEENT: atraumatic, conjunttiva clear, no obvious abnormalities on inspection of external nose and ears  NECK: no obvious masses on inspection  LUNGS: clear to auscultation bilaterally, no wheezes, rales or rhonchi, good air movement  CV: HRRR, no peripheral edema  SKIN: ingrown toenail L great toe with mild erythema and induration of skin around lateral edge of nail  MS: moves all extremities without noticeable abnormality  PSYCH: pleasant and cooperative, no obvious depression or anxiety  ASSESSMENT AND PLAN:  Discussed the following assessment and plan:  Essential hypertension -remained elevated on recheck and opted to increase norvasc/discussed risks and alternatives -follow up in 1 month -needs labs but she declined today and plans to do at follow up  Tobacco use disorder -continues to declined lung ca screening  Paronychia, left - Plan: Ambulatory referral to Podiatry -see pt instructions  Ingrown toenail - Plan: Ambulatory referral to Podiatry  Offered order for mammogram - she declined, she does with gyn and agrees to schedule  -  Patient advised to return or notify a doctor immediately if symptoms worsen or persist or new concerns arise.  Patient Instructions  BEFORE YOU LEAVE: -schedule follow up in 1 month to recheck blood pressure - COME FASTING as will plan to do labs that day as well - please bring for for work program  Increase the norvasce to 1.5 tablets (7.5mg ) daily  Please call your gynecologist today to schedule your mammogram  Soak the toe in warm salty or soapy water twice daily then dry thoroughly and apply antibiotic ointment We placed a referral for you as discussed to the podiatrist. It usually takes about 1-2 weeks to process and schedule this referral. If you have not heard from [place] regarding this  appointment in 2 weeks please contact our office.   We recommend the following healthy lifestyle measures: - eat a healthy whole foods diet consisting of regular small meals composed of vegetables, fruits, beans, nuts, seeds, healthy meats such as white chicken and fish and whole grains.  - avoid sweets, white starchy foods, fried foods, fast food, processed foods, sodas, red meet and other fattening foods.  - get a least 150-300 minutes of aerobic exercise per week.       Colin Benton R.

## 2015-08-25 ENCOUNTER — Encounter: Payer: Self-pay | Admitting: Podiatry

## 2015-08-25 ENCOUNTER — Ambulatory Visit (INDEPENDENT_AMBULATORY_CARE_PROVIDER_SITE_OTHER): Payer: BLUE CROSS/BLUE SHIELD | Admitting: Podiatry

## 2015-08-25 VITALS — BP 140/78 | HR 90 | Resp 16 | Ht 60.5 in | Wt 125.0 lb

## 2015-08-25 DIAGNOSIS — L600 Ingrowing nail: Secondary | ICD-10-CM

## 2015-08-25 NOTE — Progress Notes (Signed)
   Subjective:    Patient ID: Amanda Moss, female    DOB: April 27, 1956, 60 y.o.   MRN: [id number]  HPI Patient presents with a nail problem in their Left foot; great toe-both side. Pt stated, "has soaked in epsom salt with some relief";x1 month.   Review of Systems  All other systems reviewed and are negative.      Objective:   Physical Exam        Assessment & Plan:

## 2015-08-25 NOTE — Patient Instructions (Signed)

## 2015-08-26 NOTE — Progress Notes (Signed)
Subjective:     Patient ID: Amanda Moss, female   DOB: December 12, 1955, 60 y.o.   MRN: [id number]  HPI patient states I have an ingrown toenail my left big toe both medial and lateral borders that's painful when pressed and making shoe gear difficult. Been present for several months worse over the last month and I've tried to trim it and soak   Review of Systems  All other systems reviewed and are negative.      Objective:   Physical Exam  Constitutional: She is oriented to person, place, and time.  Cardiovascular: Intact distal pulses.   Musculoskeletal: Normal range of motion.  Neurological: She is oriented to person, place, and time.  Skin: Skin is warm.  Nursing note and vitals reviewed.  neurovascular status intact muscle strength adequate range of motion within normal limits. Patient's found to have incurvated medial and lateral border the left hallux with pain and distal redness noted on both sides but I did not note any active drainage with unfortunate deformity of the nail itself. Patient's found to have good digital perfusion and is well oriented 3     Assessment:     Ingrown toenail deformity left hallux medial and lateral border with chronic pain    Plan:     H&P and condition reviewed with patient including fact there is probable long-term nail damage. We're going to try just to work on the corners and give hopeful relief but ultimately she may lose the entire nail and she understands risk of procedure and the fact she may have long-term problems with this nail. Today I infiltrated the left hallux 60 mg Xylocaine Marcaine mixture and did sterile prep and then remove the medial and lateral borders with sterile instrumentation. I applied phenol 3 applications 30 seconds to the borders and sterile dressing and instructed on soaks

## 2015-08-29 ENCOUNTER — Encounter: Payer: Self-pay | Admitting: Family Medicine

## 2015-08-29 ENCOUNTER — Ambulatory Visit (INDEPENDENT_AMBULATORY_CARE_PROVIDER_SITE_OTHER): Payer: BLUE CROSS/BLUE SHIELD | Admitting: Family Medicine

## 2015-08-29 VITALS — BP 110/70 | HR 92 | Temp 97.7°F | Ht 60.5 in | Wt 128.0 lb

## 2015-08-29 DIAGNOSIS — I10 Essential (primary) hypertension: Secondary | ICD-10-CM

## 2015-08-29 LAB — BASIC METABOLIC PANEL
BUN: 16 mg/dL (ref 6–23)
CO2: 27 mEq/L (ref 19–32)
Calcium: 9.8 mg/dL (ref 8.4–10.5)
Chloride: 105 mEq/L (ref 96–112)
Creatinine, Ser: 0.69 mg/dL (ref 0.40–1.20)
GFR: 91.91 mL/min (ref >60.00)
Glucose, Bld: 106 mg/dL — ABNORMAL HIGH (ref 70–99)
Potassium: 4.3 mEq/L (ref 3.5–5.1)
Sodium: 142 mEq/L (ref 135–145)

## 2015-08-29 LAB — CBC WITH DIFFERENTIAL/PLATELET
Basophils Absolute: 0 10*3/uL (ref 0.0–0.1)
Basophils Relative: 0.5 % (ref 0.0–3.0)
Eosinophils Absolute: 0.1 10*3/uL (ref 0.0–0.7)
Eosinophils Relative: 1.6 % (ref 0.0–5.0)
HCT: 45 % (ref 36.0–46.0)
Hemoglobin: 15.3 g/dL — ABNORMAL HIGH (ref 12.0–15.0)
Lymphocytes Relative: 23.6 % (ref 12.0–46.0)
Lymphs Abs: 1.8 10*3/uL (ref 0.7–4.0)
MCHC: 33.9 g/dL (ref 30.0–36.0)
MCV: 93.6 fl (ref 78.0–100.0)
Monocytes Absolute: 0.5 10*3/uL (ref 0.1–1.0)
Monocytes Relative: 6.7 % (ref 3.0–12.0)
Neutro Abs: 5.1 10*3/uL (ref 1.4–7.7)
Neutrophils Relative %: 67.6 % (ref 43.0–77.0)
Platelets: 262 10*3/uL (ref 150.0–400.0)
RBC: 4.8 Mil/uL (ref 3.87–5.11)
RDW: 13.1 % (ref 11.5–15.5)
WBC: 7.5 10*3/uL (ref 4.0–10.5)

## 2015-08-29 LAB — LIPID PANEL
Cholesterol: 168 mg/dL (ref 0–200)
HDL: 49.8 mg/dL (ref >39.00)
LDL Cholesterol: 96 mg/dL (ref 0–99)
NonHDL: 118.3
Total CHOL/HDL Ratio: 3
Triglycerides: 114 mg/dL (ref 0.0–149.0)
VLDL: 22.8 mg/dL (ref 0.0–40.0)

## 2015-08-29 NOTE — Progress Notes (Signed)
HPI:  Follow up: HTN: -increased norvasc 1 month ago to 7.5mg  -denies: CP, sob, doe, swelling -reports is doing well and feel great -needs labs for form for work  ROS: See pertinent positives and negatives per HPI.  Past Medical History  Diagnosis Date  . Bleeding 2003    hemorrhoidal bleeding  . Hypertension   . Tobacco use     Past Surgical History  Procedure Laterality Date  . Cesarean section    . Cosmectic surgery      eyes  . Rhinoplasty      Family History  Problem Relation Age of Onset  . Pneumonia Mother 53  . Cancer Father 15    lung  . Hypertension Father   . Diabetes Neg Hx     Social History   Social History  . Marital Status: Divorced    Spouse Name: N/A  . Number of Children: 1  . Years of Education: N/A   Occupational History  . Corporate treasurer    Social History Main Topics  . Smoking status: Former Smoker -- 0.50 packs/day    Types: Cigarettes  . Smokeless tobacco: None  . Alcohol Use: 0.0 oz/week    0 Standard drinks or equivalent per week     Comment: 2-3 drinks a few days per week  . Drug Use: No  . Sexual Activity: Not Asked   Other Topics Concern  . 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  amLODipine (NORVASC) 5 MG tablet, Take 1.5 tablets (7.5 mg total) by mouth daily., Disp: 45 tablet, Rfl: 3  EXAM:  Filed Vitals:   08/29/15 0854  BP: 110/70  Pulse: 92  Temp: 97.7 F (36.5 C)    Body mass index is 24.58 kg/(m^2).  GENERAL: vitals reviewed and listed above, alert, oriented, appears well hydrated and in no acute distress  HEENT: atraumatic, conjunttiva clear, no obvious abnormalities on inspection of external nose and ears  NECK: no obvious masses on inspection  LUNGS: clear  to auscultation bilaterally, no wheezes, rales or rhonchi, good air movement  CV: HRRR, no peripheral edema  MS: moves all extremities without noticeable abnormality  PSYCH: pleasant and cooperative, no obvious depression or anxiety  ASSESSMENT AND PLAN:  Discussed the following assessment and plan:  Essential hypertension - Plan: Lipid Panel, Basic metabolic panel, CBC with Differential  -FASTING labs per orders -Continue new dose of Norvasc -Lifestyle recommendations  -Patient advised to return or notify a doctor immediately if symptoms worsen or persist or new concerns arise.  Patient Instructions  Before you leave: -Labs -Please schedule follow-up in 4-6 months  -We have ordered labs or studies at this visit. It can take up to 1-2 weeks for results and processing. We will contact you with instructions IF your results are abnormal. Normal results will be released to your [facility]. If you have not heard from [place] or can not find your results in [facility] in 2 weeks please contact our office.  We recommend the following healthy lifestyle measures: - eat a healthy whole foods diet consisting of regular small meals composed of vegetables, fruits, beans, nuts, seeds, healthy meats such as  white chicken and fish and whole grains.  - avoid sweets, white starchy foods, fried foods, fast food, processed foods, sodas, red meet and other fattening foods.  - get a least 150-300 minutes of aerobic exercise per week.   Wendie Simmer will complete form and contact you when it is ready for pick up.          Colin Benton R.

## 2015-08-29 NOTE — Progress Notes (Signed)
Pre visit review using our clinic review tool, if applicable. No additional management support is needed unless otherwise documented below in the visit note. 

## 2015-08-29 NOTE — Patient Instructions (Signed)
Before you leave: -Labs -Please schedule follow-up in 4-6 months  -We have ordered labs or studies at this visit. It can take up to 1-2 weeks for results and processing. We will contact you with instructions IF your results are abnormal. Normal results will be released to your [facility]. If you have not heard from [place] or can not find your results in [facility] in 2 weeks please contact our office.  We recommend the following healthy lifestyle measures: - eat a healthy whole foods diet consisting of regular small meals composed of vegetables, fruits, beans, nuts, seeds, healthy meats such as white chicken and fish and whole grains.  - avoid sweets, white starchy foods, fried foods, fast food, processed foods, sodas, red meet and other fattening foods.  - get a least 150-300 minutes of aerobic exercise per week.   Wendie Simmer will complete form and contact you when it is ready for pick up.

## 2015-09-11 ENCOUNTER — Telehealth: Payer: Self-pay | Admitting: *Deleted

## 2015-09-11 NOTE — Telephone Encounter (Signed)
Called patient at [phone] (Cell #) to check to see how they were doing from their ingrown toenail procedure that was performed on Friday, August 25, 2015. Pt stated, "Doing okay, but does have some redness around the cubicle". I asked patient if they were still soaking their toes and they said yes. They are also airing out their toe to help heal as well.

## 2015-09-21 ENCOUNTER — Telehealth: Payer: Self-pay | Admitting: Family Medicine

## 2015-09-21 NOTE — Telephone Encounter (Signed)
Pt said she left a get fit for life form to be filled out by Dr Maudie Mercury. It was based on her lab work that she had done on 08/29/15  [phone]

## 2015-09-21 NOTE — Telephone Encounter (Signed)
I called the pt and apologized for the delay and since the results were sent to her Mychart acct I did not see these to complete the form.  I advised her this is complete today and will be left at the front desk for her to pick up and she agreed.

## 2015-12-07 ENCOUNTER — Other Ambulatory Visit: Payer: Self-pay | Admitting: Family Medicine

## 2016-02-26 NOTE — Progress Notes (Signed)
HPI:  HTN: -meds: norvasc 7.5mg  -denies: CP, sob, doe, swelling -reports is doing great -needs labs for form for work  Hx of Tobacco Use: -she quit 12/2014 -1/2-1 PPD for 40 years -she declined lung cancer screening again - advised to call if changes her she mind; declined again -denies: cough, wheeze, sob, hemoptysis  Osteopenia: -had dexa with gyn -did high dose D2 supplementation for vit D def -now wonders how much OTC D3 to take -doing weight bearing exercise   HM: mammo - just did with gyn, flu shot - declined  ROS: See pertinent positives and negatives per HPI.  Past Medical History:  Diagnosis Date  . Bleeding 2003   hemorrhoidal bleeding  . Hypertension   . Tobacco use   . Vitamin D deficiency     Past Surgical History:  Procedure Laterality Date  . CESAREAN SECTION    . cosmectic surgery     eyes  . RHINOPLASTY      Family History  Problem Relation Age of Onset  . Pneumonia Mother 90  . Cancer Father 53    lung  . Hypertension Father   . Diabetes Neg Hx     Social History   Social History  . Marital status: Divorced    Spouse name: N/A  . Number of children: 1  . Years of education: N/A   Occupational History  . 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 Smoking status: Former Smoker    Packs/day: 0.50    Types: Cigarettes  . Smokeless tobacco: None  . Alcohol use 0.0 oz/week     Comment: 2-3 drinks a few days per week  . Drug use: No  . Sexual activity: Not Asked   Other Topics Concern  . 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  amLODipine (NORVASC) 5 MG tablet, take 1 and 1/2 tablets by mouth once daily, Disp: 45 tablet, Rfl: 3 .  Multiple Vitamins-Minerals  (PRESERVISION AREDS PO), Take by mouth., Disp: , Rfl:   EXAM:  Vitals:   02/27/16 0815  BP: 108/72  Pulse: 99  Temp: 98.2 F (36.8 C)    Body mass index is 24.64 kg/m.  GENERAL: vitals reviewed and listed above, alert, oriented, appears well hydrated and in no acute distress  HEENT: atraumatic, conjunttiva clear, no obvious abnormalities on inspection of external nose and ears  NECK: no obvious masses on inspection  LUNGS: clear to auscultation bilaterally, no wheezes, rales or rhonchi, good air movement  CV: HRRR, no peripheral edema  MS: moves all extremities without noticeable abnormality  PSYCH: pleasant and cooperative, no obvious depression or anxiety  ASSESSMENT AND PLAN:  Discussed the following assessment and plan:  Essential hypertension -recheck BMP -cont current treatment  Osteopenia -advised weight bearing exercise, healthy diet w/ adequate calcium intake, discussed Vit D3 and advised 1000 IU daily for now with recheck if she wishes with next labs. Discussed quality/safety issues with supplements and consumer labs approved products.  Vitamin D deficiency -see above  Assistant to obtain mammo and dexa reports from gyn  -Patient advised to return or notify a doctor immediately  if symptoms worsen or persist or new concerns arise.  Patient Instructions  BEFORE YOU LEAVE: -follow up: 3-4 months -labs  Vit D3 1000 IU daily. Source naturals drops is a good option. Adequate calcium from diet - 1200mg .   We recommend the following healthy lifestyle for LIFE: 1) Small portions.   Tip: eat off of a salad plate instead of a dinner plate.  Tip: It is ok to feel hungry after a meal - that likely means you ate an appropriate portion.  Tip: if you need more or a snack choose fruits, veggies and/or a handful of nuts or seeds.  2) Eat a healthy clean diet.  * Tip: Avoid (less then 1 serving per week): processed foods, sweets, sweetened drinks, white starches  (rice, flour, bread, potatoes, pasta, etc), red meat, fast foods, butter  *Tip: CHOOSE instead   * 5-9 servings per day of fresh or frozen fruits and vegetables (but not corn, potatoes, bananas, canned or dried fruit)   *nuts and seeds, beans   *olives and olive oil   *small portions of lean meats such as fish and white chicken    *small portions of whole grains  3)Get at least 150 minutes of sweaty aerobic exercise per week.  4)Reduce stress - consider counseling, meditation and relaxation to balance other aspects of your life.     Colin Benton R., DO

## 2016-02-27 ENCOUNTER — Ambulatory Visit (INDEPENDENT_AMBULATORY_CARE_PROVIDER_SITE_OTHER): Payer: BLUE CROSS/BLUE SHIELD | Admitting: Family Medicine

## 2016-02-27 ENCOUNTER — Encounter: Payer: Self-pay | Admitting: Family Medicine

## 2016-02-27 VITALS — BP 108/72 | HR 99 | Temp 98.2°F | Ht 60.5 in | Wt 128.3 lb

## 2016-02-27 DIAGNOSIS — R739 Hyperglycemia, unspecified: Secondary | ICD-10-CM

## 2016-02-27 DIAGNOSIS — M8580 Other specified disorders of bone density and structure, unspecified site: Secondary | ICD-10-CM

## 2016-02-27 DIAGNOSIS — E559 Vitamin D deficiency, unspecified: Secondary | ICD-10-CM | POA: Diagnosis not present

## 2016-02-27 DIAGNOSIS — I10 Essential (primary) hypertension: Secondary | ICD-10-CM | POA: Diagnosis not present

## 2016-02-27 LAB — BASIC METABOLIC PANEL
BUN: 16 mg/dL (ref 6–23)
CO2: 31 mEq/L (ref 19–32)
Calcium: 9.9 mg/dL (ref 8.4–10.5)
Chloride: 102 mEq/L (ref 96–112)
Creatinine, Ser: 0.76 mg/dL (ref 0.40–1.20)
GFR: 82.07 mL/min (ref >60.00)
Glucose, Bld: 120 mg/dL — ABNORMAL HIGH (ref 70–99)
Potassium: 3.9 mEq/L (ref 3.5–5.1)
Sodium: 141 mEq/L (ref 135–145)

## 2016-02-27 NOTE — Progress Notes (Signed)
Pre visit review using our clinic review tool, if applicable. No additional management support is needed unless otherwise documented below in the visit note. 

## 2016-02-27 NOTE — Patient Instructions (Signed)
BEFORE YOU LEAVE: -follow up: 3-4 months -labs  Vit D3 1000 IU daily. Source naturals drops is a good option. Adequate calcium from diet - 1200mg .   We recommend the following healthy lifestyle for LIFE: 1) Small portions.   Tip: eat off of a salad plate instead of a dinner plate.  Tip: It is ok to feel hungry after a meal - that likely means you ate an appropriate portion.  Tip: if you need more or a snack choose fruits, veggies and/or a handful of nuts or seeds.  2) Eat a healthy clean diet.  * Tip: Avoid (less then 1 serving per week): processed foods, sweets, sweetened drinks, white starches (rice, flour, bread, potatoes, pasta, etc), red meat, fast foods, butter  *Tip: CHOOSE instead   * 5-9 servings per day of fresh or frozen fruits and vegetables (but not corn, potatoes, bananas, canned or dried fruit)   *nuts and seeds, beans   *olives and olive oil   *small portions of lean meats such as fish and white chicken    *small portions of whole grains  3)Get at least 150 minutes of sweaty aerobic exercise per week.  4)Reduce stress - consider counseling, meditation and relaxation to balance other aspects of your life.

## 2016-04-14 ENCOUNTER — Other Ambulatory Visit: Payer: Self-pay | Admitting: Family Medicine

## 2016-06-03 ENCOUNTER — Ambulatory Visit: Payer: BLUE CROSS/BLUE SHIELD | Admitting: Family Medicine

## 2016-07-01 NOTE — Progress Notes (Signed)
HPI:  Amanda Moss is a pleasant 61 yo with a PMH significant for HTN, Tobacco use and osteopenia here for follow up. Reports doing well. Has several requests. Wants new rx for norvasc to take to a different pharmacy. Has nasal lesion she reports saw [facility] for and did not like doctor. Dx with wart. Saw Dr. Lucia Gaskins for this and now want to remove again and want my thoughts on Dr. Lucia Gaskins as she did like him. Wants recs again on good Vit D product. Wants something to help with wrinkles and age spots on the face and minor blemishes.  Denies SOB, CP, DOE, HA. Declined lung cancer screening in the past. Declines again today. Reports not smoking. Agrees to hep c screening with labs today. HAd mammogram with Dr. Matthew Saras in June per her report. Refused flu shot.  ROS: See pertinent positives and negatives per HPI.  Past Medical History:  Diagnosis Date  . Bleeding 2003   hemorrhoidal bleeding  . Hypertension   . Tobacco use   . Vitamin D deficiency     Past Surgical History:  Procedure Laterality Date  . CESAREAN SECTION    . cosmectic surgery     eyes  . RHINOPLASTY      Family History  Problem Relation Age of Onset  . Pneumonia Mother 8  . Cancer Father 32    lung  . Hypertension Father   . Diabetes Neg Hx     Social History   Social History  . Marital status: Divorced    Spouse name: N/A  . Number of children: 1  . Years of education: N/A   Occupational History  . 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 Smoking status: Former Smoker    Packs/day: 0.50    Types: Cigarettes  . Smokeless tobacco: None  . Alcohol use 0.0 oz/week     Comment: 2-3 drinks a few days per week  . Drug use: No  . Sexual activity: Not Asked   Other Topics Concern  . 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  amLODipine (NORVASC) 5 MG tablet, Take 1.5 tablets (7.5 mg total) by mouth daily., Disp: 45 tablet, Rfl: 5 .  Cholecalciferol (VITAMIN D PO), Take by mouth., Disp: , Rfl:  .  Multiple Vitamins-Minerals (PRESERVISION AREDS PO), Take by mouth., Disp: , Rfl:  .  tretinoin (ALTRALIN) 0.05 % gel, Apply small amount at bedtime., Disp: 45 g, Rfl: 1  EXAM:  Vitals:   07/02/16 1311  BP: 102/80  Pulse: 83  Temp: 98 F (36.7 C)    Body mass index is 23.32 kg/m.  GENERAL: vitals reviewed and listed above, alert, oriented, appears well hydrated and in no acute distress  HEENT: atraumatic, conjunttiva clear, no obvious abnormalities on inspection of external nose and ears  NECK: no obvious masses on inspection  LUNGS: clear to auscultation bilaterally, no wheezes, rales or rhonchi, good air movement  CV: HRRR, no peripheral edema  MS: moves all extremities without noticeable abnormality  PSYCH: pleasant and cooperative, no obvious depression or anxiety  ASSESSMENT AND PLAN:  Discussed the following  assessment and plan:  Essential hypertension - Plan: Basic metabolic panel, CBC (no diff)  Need for hepatitis C screening test - Plan: Hep C Antibody  Vitamin D deficiency  Nasal lesion  Age spots  -advised to f/u with Dr. Lucia Gaskins about the face, he has taken good care of my patients -she wants to try retinoid for the face, sent, also advised cerave cream -Source natural Vit D 3 top of the lis ton recent Consumer labs review -labs per order -Patient advised to return or notify a doctor immediately if symptoms worsen or persist or new concerns arise.  Patient Instructions  BEFORE YOU LEAVE: -labs -follow up: 3-4 months -obtain/update mammogram  Call your ENT doctor today for appointment about the nasal lesion  Source Natural Vit D3 drops 1000-2000 IU daily  Try the atrelin gel with  cerave facial lotion as we discussed  We have ordered labs or studies at this visit. It can take up to 1-2 weeks for results and processing. IF results require follow up or explanation, we will call you with instructions. Clinically stable results will be released to your [facility]. If you have not heard from [place] or cannot find your results in [facility] in 2 weeks please contact our office at [phone].  If you are not yet signed up for [facility], please consider signing up.   We recommend the following healthy lifestyle for LIFE: 1) Small portions.   Tip: eat off of a salad plate instead of a dinner plate.  Tip: It is ok to feel hungry after a meal - that likely means you ate an appropriate portion.  Tip: if you need more or a snack choose fruits, veggies and/or a handful of nuts or seeds.  2) Eat a healthy clean diet.  * Tip: Avoid (less then 1 serving per week): processed foods, sweets, sweetened drinks, white starches (rice, flour, bread, potatoes, pasta, etc), red meat, fast foods, butter  *Tip: CHOOSE instead   * 5-9 servings per day of fresh or frozen fruits and vegetables (but not corn, potatoes, bananas, canned or dried fruit)   *nuts and seeds, beans   *olives and olive oil   *small portions of lean meats such as fish and white chicken    *small portions of whole grains  3)Get at least 150 minutes of sweaty aerobic exercise per week.  4)Reduce stress - consider counseling, meditation and relaxation to balance other aspects of your life.              Colin Benton R., DO

## 2016-07-02 ENCOUNTER — Ambulatory Visit (INDEPENDENT_AMBULATORY_CARE_PROVIDER_SITE_OTHER): Payer: BLUE CROSS/BLUE SHIELD | Admitting: Family Medicine

## 2016-07-02 ENCOUNTER — Encounter: Payer: Self-pay | Admitting: Family Medicine

## 2016-07-02 VITALS — BP 102/80 | HR 83 | Temp 98.0°F | Ht 60.5 in | Wt 121.4 lb

## 2016-07-02 DIAGNOSIS — J3489 Other specified disorders of nose and nasal sinuses: Secondary | ICD-10-CM

## 2016-07-02 DIAGNOSIS — L814 Other melanin hyperpigmentation: Secondary | ICD-10-CM

## 2016-07-02 DIAGNOSIS — Z1159 Encounter for screening for other viral diseases: Secondary | ICD-10-CM

## 2016-07-02 DIAGNOSIS — I10 Essential (primary) hypertension: Secondary | ICD-10-CM

## 2016-07-02 DIAGNOSIS — E559 Vitamin D deficiency, unspecified: Secondary | ICD-10-CM | POA: Diagnosis not present

## 2016-07-02 MED ORDER — AMLODIPINE BESYLATE 5 MG PO TABS: 7.5000 mg | ORAL_TABLET | Freq: Every day | ORAL | 5 refills | Status: DC

## 2016-07-02 MED ORDER — TRETINOIN 0.05 % EX GEL: CUTANEOUS | 1 refills | Status: DC

## 2016-07-02 NOTE — Progress Notes (Signed)
Pre visit review using our clinic review tool, if applicable. No additional management support is needed unless otherwise documented below in the visit note. 

## 2016-07-02 NOTE — Patient Instructions (Signed)
BEFORE YOU LEAVE: -labs -follow up: 3-4 months -obtain/update mammogram  Call your ENT doctor today for appointment about the nasal lesion  Source Natural Vit D3 drops 1000-2000 IU daily  Try the atrelin gel with cerave facial lotion as we discussed  We have ordered labs or studies at this visit. It can take up to 1-2 weeks for results and processing. IF results require follow up or explanation, we will call you with instructions. Clinically stable results will be released to your [facility]. If you have not heard from [place] or cannot find your results in [facility] in 2 weeks please contact our office at [phone].  If you are not yet signed up for [facility], please consider signing up.   We recommend the following healthy lifestyle for LIFE: 1) Small portions.   Tip: eat off of a salad plate instead of a dinner plate.  Tip: It is ok to feel hungry after a meal - that likely means you ate an appropriate portion.  Tip: if you need more or a snack choose fruits, veggies and/or a handful of nuts or seeds.  2) Eat a healthy clean diet.  * Tip: Avoid (less then 1 serving per week): processed foods, sweets, sweetened drinks, white starches (rice, flour, bread, potatoes, pasta, etc), red meat, fast foods, butter  *Tip: CHOOSE instead   * 5-9 servings per day of fresh or frozen fruits and vegetables (but not corn, potatoes, bananas, canned or dried fruit)   *nuts and seeds, beans   *olives and olive oil   *small portions of lean meats such as fish and white chicken    *small portions of whole grains  3)Get at least 150 minutes of sweaty aerobic exercise per week.  4)Reduce stress - consider counseling, meditation and relaxation to balance other aspects of your life.

## 2016-07-08 ENCOUNTER — Telehealth: Payer: Self-pay

## 2016-07-08 NOTE — Telephone Encounter (Signed)
Received PA request from [facility] for Tretinoin. PA submitted & is pending. Key: [facility]

## 2016-07-09 NOTE — Telephone Encounter (Signed)
PA approved, form faxed back to pharmacy. 

## 2016-07-11 ENCOUNTER — Other Ambulatory Visit: Payer: BLUE CROSS/BLUE SHIELD

## 2016-07-11 LAB — CBC
HCT: 45.9 % (ref 36.0–46.0)
Hemoglobin: 15.8 g/dL — ABNORMAL HIGH (ref 12.0–15.0)
MCHC: 34.4 g/dL (ref 30.0–36.0)
MCV: 93.2 fl (ref 78.0–100.0)
Platelets: 232 10*3/uL (ref 150.0–400.0)
RBC: 4.93 Mil/uL (ref 3.87–5.11)
RDW: 13.3 % (ref 11.5–15.5)
WBC: 7.4 10*3/uL (ref 4.0–10.5)

## 2016-07-11 LAB — BASIC METABOLIC PANEL
BUN: 17 mg/dL (ref 6–23)
CO2: 29 mEq/L (ref 19–32)
Calcium: 9.9 mg/dL (ref 8.4–10.5)
Chloride: 106 mEq/L (ref 96–112)
Creatinine, Ser: 0.67 mg/dL (ref 0.40–1.20)
GFR: 94.81 mL/min (ref >60.00)
Glucose, Bld: 127 mg/dL — ABNORMAL HIGH (ref 70–99)
Potassium: 4.3 mEq/L (ref 3.5–5.1)
Sodium: 140 mEq/L (ref 135–145)

## 2016-07-12 LAB — HEPATITIS C ANTIBODY: HCV Ab: NEGATIVE

## 2016-08-30 ENCOUNTER — Ambulatory Visit (INDEPENDENT_AMBULATORY_CARE_PROVIDER_SITE_OTHER): Payer: BLUE CROSS/BLUE SHIELD | Admitting: Family Medicine

## 2016-08-30 ENCOUNTER — Encounter: Payer: Self-pay | Admitting: Family Medicine

## 2016-08-30 VITALS — BP 120/82 | HR 86 | Temp 98.0°F | Ht 60.5 in | Wt 112.2 lb

## 2016-08-30 DIAGNOSIS — J329 Chronic sinusitis, unspecified: Secondary | ICD-10-CM

## 2016-08-30 MED ORDER — BENZONATATE 100 MG PO CAPS: 100.0000 mg | ORAL_CAPSULE | Freq: Three times a day (TID) | ORAL | 0 refills | Status: DC | PRN

## 2016-08-30 MED ORDER — AMOXICILLIN-POT CLAVULANATE 875-125 MG PO TABS: 1.0000 | ORAL_TABLET | Freq: Two times a day (BID) | ORAL | 0 refills | Status: DC

## 2016-08-30 NOTE — Progress Notes (Signed)
HPI:  Acute visit for "URI" -started: 5 days ago -symptoms:nasal congestion - clear, PND, cough -denies:fever, SOB, NVD, tooth pain, body aches, wheezing, ear pain or malaise -has tried: nothing -sick contacts/travel/risks: no reported flu, strep or tick exposure -Hx of: allergies, smoking -having nasal surgery with packing 3/29 for skin lesions and wants to make sure better by then  ROS: See pertinent positives and negatives per HPI.  Past Medical History:  Diagnosis Date  . Bleeding 2003   hemorrhoidal bleeding  . Hypertension   . Tobacco use   . Vitamin D deficiency     Past Surgical History:  Procedure Laterality Date  . CESAREAN SECTION    . cosmectic surgery     eyes  . RHINOPLASTY      Family History  Problem Relation Age of Onset  . Pneumonia Mother 62  . Cancer Father 65    lung  . Hypertension Father   . Diabetes Neg Hx     Social History   Social History  . Marital status: Divorced    Spouse name: N/A  . Number of children: 1  . Years of education: N/A   Occupational History  . graphic designer [place] History Main Topics  . Smoking status: Former Smoker    Packs/day: 0.50    Types: Cigarettes  . Smokeless tobacco: Never Used  . Alcohol use 0.0 oz/week     Comment: 2-3 drinks a few days per week  . Drug use: No  . Sexual activity: Not Asked   Other Topics Concern  . 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  amLODipine (NORVASC) 5 MG tablet, Take 1.5 tablets (7.5 mg total) by mouth daily., Disp: 45 tablet, Rfl: 5 .  Cholecalciferol (VITAMIN D PO), Take by mouth., Disp: , Rfl:  .  Multiple Vitamins-Minerals (PRESERVISION AREDS PO), Take by mouth., Disp: , Rfl:  .  tretinoin  (ALTRALIN) 0.05 % gel, Apply small amount at bedtime., Disp: 45 g, Rfl: 1 .  amoxicillin-clavulanate (AUGMENTIN) 875-125 MG tablet, Take 1 tablet by mouth 2 (two) times daily., Disp: 14 tablet, Rfl: 0 .  benzonatate (TESSALON PERLES) 100 MG capsule, Take 1 capsule (100 mg total) by mouth 3 (three) times daily as needed., Disp: 20 capsule, Rfl: 0  EXAM:  Vitals:   08/30/16 0916  BP: 120/82  Pulse: 86  Temp: 98 F (36.7 C)    Body mass index is 21.55 kg/m.  GENERAL: vitals reviewed and listed above, alert, oriented, appears well hydrated and in no acute distress  HEENT: atraumatic, conjunttiva clear, no obvious abnormalities on inspection of external nose and ears except lesion R nasal opening, normal appearance of ear canals and TMs, thick yellow nasal congestion R, mild post oropharyngeal erythema with PND, no tonsillar edema or exudate, no sinus TTP  NECK: no obvious masses on inspection  LUNGS: clear to auscultation bilaterally, no wheezes, rales or rhonchi, good air movement  CV: HRRR, no peripheral edema  MS: moves all extremities without noticeable abnormality  PSYCH: pleasant and cooperative, no obvious depression or anxiety  ASSESSMENT AND PLAN:  Discussed the following assessment  and plan:  Rhinosinusitis  -given HPI and exam findings today, a serious infection or illness is unlikely. We discussed potential etiologies, with VURI or allergies being most likely, and advised allergy regimen, short course nasal decongestant, tessalon for cough, supportive care and monitoring. We discussed treatment side effects, likely course, antibiotic misuse, transmission, and signs of developing a serious illness. She would like delayed abx incase worsening, signs of sinus infection given upcoming surgery. Risks and proper use and discarding if not used discussed. -of course, we advised to return or notify a doctor immediately if symptoms worsen or persist or new concerns  arise.    Patient Instructions  BEFORE YOU LEAVE: -follow up: in 3 months  Start saline 1-2 times daily and zyrtec nightly.  Afrin nasal spray for 3 days only. Then STOP, do not use longer.  Tessalon as needed for cough.  If not improving in 4-5 days or if worsening can start antibiotic. Otherwise please shred and discard the antibiotic if you do not need to use it.  I hope you are feeling better soon! Seek care immediately if worsening, new concerns or you are not improving with treatment.    WE NOW OFFER   [facility] FAST TRACK!!!  SAME DAY Appointments for ACUTE CARE  Such as: Sprains, Injuries, cuts, abrasions, rashes, muscle pain, joint pain, back pain Colds, flu, sore throats, headache, allergies, cough, fever  Ear pain, sinus and eye infections Abdominal pain, nausea, vomiting, diarrhea, upset stomach Animal/insect bites  3 Easy Ways to Schedule: Walk-In Scheduling Call in scheduling Mychart Sign-up: [email or web address]           Colin Benton R., DO

## 2016-08-30 NOTE — Progress Notes (Signed)
Pre visit review using our clinic review tool, if applicable. No additional management support is needed unless otherwise documented below in the visit note. 

## 2016-08-30 NOTE — Patient Instructions (Signed)
BEFORE YOU LEAVE: -follow up: in 3 months  Start saline 1-2 times daily and zyrtec nightly.  Afrin nasal spray for 3 days only. Then STOP, do not use longer.  Tessalon as needed for cough.  If not improving in 4-5 days or if worsening can start antibiotic. Otherwise please shred and discard the antibiotic if you do not need to use it.  I hope you are feeling better soon! Seek care immediately if worsening, new concerns or you are not improving with treatment.    WE NOW OFFER   [facility] FAST TRACK!!!  SAME DAY Appointments for ACUTE CARE  Such as: Sprains, Injuries, cuts, abrasions, rashes, muscle pain, joint pain, back pain Colds, flu, sore throats, headache, allergies, cough, fever  Ear pain, sinus and eye infections Abdominal pain, nausea, vomiting, diarrhea, upset stomach Animal/insect bites  3 Easy Ways to Schedule: Walk-In Scheduling Call in scheduling Mychart Sign-up: [email or web address]

## 2017-01-03 ENCOUNTER — Other Ambulatory Visit: Payer: Self-pay | Admitting: Family Medicine

## 2017-03-06 ENCOUNTER — Encounter: Payer: Self-pay | Admitting: Family Medicine

## 2017-04-13 ENCOUNTER — Other Ambulatory Visit: Payer: Self-pay | Admitting: Family Medicine

## 2017-05-14 ENCOUNTER — Other Ambulatory Visit: Payer: Self-pay | Admitting: Family Medicine

## 2017-06-14 ENCOUNTER — Other Ambulatory Visit: Payer: Self-pay | Admitting: Family Medicine

## 2017-06-18 ENCOUNTER — Other Ambulatory Visit: Payer: Self-pay | Admitting: Family Medicine

## 2017-06-26 ENCOUNTER — Encounter: Payer: Self-pay | Admitting: Family Medicine

## 2017-07-20 ENCOUNTER — Other Ambulatory Visit: Payer: Self-pay | Admitting: Family Medicine

## 2017-08-08 ENCOUNTER — Ambulatory Visit: Payer: BLUE CROSS/BLUE SHIELD | Admitting: Family Medicine

## 2017-08-08 DIAGNOSIS — Z0289 Encounter for other administrative examinations: Secondary | ICD-10-CM

## 2017-08-26 ENCOUNTER — Encounter: Payer: Self-pay | Admitting: Family Medicine

## 2017-09-03 ENCOUNTER — Other Ambulatory Visit: Payer: Self-pay | Admitting: Family Medicine

## 2017-09-29 ENCOUNTER — Other Ambulatory Visit: Payer: Self-pay | Admitting: *Deleted

## 2017-09-29 MED ORDER — AMLODIPINE BESYLATE 5 MG PO TABS: ORAL_TABLET | ORAL | 0 refills | Status: DC

## 2017-09-29 NOTE — Telephone Encounter (Signed)
Rx done. 

## 2017-11-08 ENCOUNTER — Other Ambulatory Visit: Payer: Self-pay | Admitting: Family Medicine

## 2017-12-07 ENCOUNTER — Other Ambulatory Visit: Payer: Self-pay | Admitting: Family Medicine

## 2017-12-08 ENCOUNTER — Other Ambulatory Visit: Payer: Self-pay | Admitting: Family Medicine

## 2017-12-09 MED ORDER — AMLODIPINE BESYLATE 5 MG PO TABS: ORAL_TABLET | ORAL | 0 refills | Status: DC

## 2017-12-09 NOTE — Telephone Encounter (Signed)
Dr Kim pt. 

## 2017-12-25 ENCOUNTER — Ambulatory Visit (INDEPENDENT_AMBULATORY_CARE_PROVIDER_SITE_OTHER): Payer: BLUE CROSS/BLUE SHIELD | Admitting: Family Medicine

## 2017-12-25 ENCOUNTER — Encounter: Payer: Self-pay | Admitting: Family Medicine

## 2017-12-25 VITALS — BP 120/80 | HR 88 | Temp 98.4°F | Ht 60.0 in | Wt 123.9 lb

## 2017-12-25 DIAGNOSIS — Z0000 Encounter for general adult medical examination without abnormal findings: Secondary | ICD-10-CM | POA: Diagnosis not present

## 2017-12-25 DIAGNOSIS — I10 Essential (primary) hypertension: Secondary | ICD-10-CM | POA: Diagnosis not present

## 2017-12-25 DIAGNOSIS — F320 Major depressive disorder, single episode, mild: Secondary | ICD-10-CM

## 2017-12-25 DIAGNOSIS — F17200 Nicotine dependence, unspecified, uncomplicated: Secondary | ICD-10-CM

## 2017-12-25 DIAGNOSIS — F439 Reaction to severe stress, unspecified: Secondary | ICD-10-CM

## 2017-12-25 MED ORDER — NICOTINE 7 MG/24HR TD PT24: 7.0000 mg | MEDICATED_PATCH | Freq: Every day | TRANSDERMAL | 0 refills | Status: DC

## 2017-12-25 MED ORDER — NICOTINE 14 MG/24HR TD PT24: 14.0000 mg | MEDICATED_PATCH | Freq: Every day | TRANSDERMAL | 0 refills | Status: DC

## 2017-12-25 NOTE — Progress Notes (Signed)
HPI:  Using dictation device. Unfortunately this device frequently misinterprets words/phrases.  Here for CPE:  -Concerns and/or follow up today:   Chronic medical problems summarized below were reviewed for changes  HTN: -meds: norvasc 5 mg  Hx Tobacco use: -refused lung cancer screening in the past, refuses again today -had quit smoking at appt 06/2016 -now back to 1/2 ppd, enjoys, but also wants to quit -did not like chantix  Stress/anxiety/depression: -mild -work stress related -no severe symptoms, no thoughts of harm   -Diet: variety of foods, balance and well rounded, larger portion sizes -Exercise: no regular exercise -Taking folic acid, vitamin D or calcium: no -Diabetes and Dyslipidemia Screening: not fasting, had labs with lipids and hgba1c thru work in march - copy to scan, very mild elvation ldl, o/w ok -Vaccines: see vaccine section EPIC -pap history: sees Dr. Matthew Saras in gyn -FDLMP: see nursing notes -sexual activity: yes, female partner, no new partners -wants STI testing (Hep C if born [birth year]): no -FH breast, colon or ovarian ca: see FH Last mammogram: did with Dr. Matthew Saras in the past per her report - not utd in epic Last colon cancer screening: 2016 - due for repeat 03/2018 per epic documentation Breast Ca Risk Assessment: see family history and pt history DEXA (>/= 95): see assistant notes  Plans to see dermatologist for skin exam  -Alcohol, Tobacco, drug use: see social history  Review of Systems - no fevers, unintentional weight loss, vision loss, hearing loss, chest pain, sob, hemoptysis, melena, hematochezia, hematuria, genital discharge, changing or concerning skin lesions, bleeding, bruising, loc, thoughts of self harm or SI  Past Medical History:  Diagnosis Date  . Bleeding 2003   hemorrhoidal bleeding  . Hypertension   . Squamous cell cancer of skin of nose    per patient treated by Dr Lucia Gaskins  . Tobacco use   . Vitamin D deficiency      Past Surgical History:  Procedure Laterality Date  . CESAREAN SECTION    . cosmectic surgery     eyes  . RHINOPLASTY      Family History  Problem Relation Age of Onset  . Pneumonia Mother 64  . Cancer Father 11       lung  . Hypertension Father   . Diabetes Neg Hx     Social History   Socioeconomic History  . Marital status: Divorced    Spouse name: Not on file  . Number of children: 1  . Years of education: Not on file  . Highest education level: Not on file  Occupational History  . Occupation: Psychiatrist: [employer]  Social Needs  . Financial resource strain: Not on file  . Food insecurity:    Worry: Not on file    Inability: Not on file  . Transportation needs:    Medical: Not on file    Non-medical: Not on file  Tobacco Use  . Smoking status: Former Smoker    Packs/day: 0.50    Types: Cigarettes  . Smokeless tobacco: Never Used  Substance and Sexual Activity  . Alcohol use: Yes    Alcohol/week: 0.0 oz    Comment: 2-3 drinks a few days per week  . Drug use: No  . Sexual activity: Not on file  Lifestyle  . Physical activity:    Days per week: Not on file    Minutes per session: Not on file  . Stress: Not on file  Relationships  . Social  connections:    Talks on phone: Not on file    Gets together: Not on file    Attends religious service: Not on file    Active member of club or organization: Not on file    Attends meetings of clubs or organizations: Not on file    Relationship status: Not on file  Other Topics Concern  . 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  amLODipine (NORVASC) 5 MG tablet, Take 1 and 1/2 tablets by mouth once daily, Disp: 45 tablet, Rfl: 0 .  Cholecalciferol  (VITAMIN D PO), Take by mouth., Disp: , Rfl:  .  fluticasone (FLONASE) 50 MCG/ACT nasal spray, Place into both nostrils daily., Disp: , Rfl:  .  Multiple Vitamins-Minerals (PRESERVISION AREDS PO), Take by mouth., Disp: , Rfl:  .  tretinoin (ALTRALIN) 0.05 % gel, Apply small amount at bedtime., Disp: 45 g, Rfl: 1 .  nicotine (NICODERM CQ - DOSED IN MG/24 HOURS) 14 mg/24hr patch, Place 1 patch (14 mg total) onto the skin daily., Disp: 42 patch, Rfl: 0 .  nicotine (NICODERM CQ - DOSED IN MG/24 HR) 7 mg/24hr patch, Place 1 patch (7 mg total) onto the skin daily., Disp: 14 patch, Rfl: 0  EXAM:  Vitals:   12/25/17 1550  BP: 120/80  Pulse: 88  Temp: 98.4 F (36.9 C)    GENERAL: vitals reviewed and listed below, alert, oriented, appears well hydrated and in no acute distress  HEENT: head atraumatic, PERRLA, normal appearance of eyes, ears, nose and mouth. moist mucus membranes.  NECK: supple, no masses or lymphadenopathy  LUNGS: clear to auscultation bilaterally, no rales, rhonchi or wheeze  CV: HRRR, no peripheral edema or cyanosis, normal pedal pulses  ABDOMEN: bowel sounds normal, soft, non tender to palpation, no masses, no rebound or guarding  GU/BREAST: declined, does with gyn  SKIN: no rash or abnormal lesions on exposed portions, plans to see derm for full skin exam  MS: normal gait, moves all extremities normally  NEURO: normal gait, speech and thought processing grossly intact, muscle tone grossly intact throughout  PSYCH: normal affect, pleasant and cooperative  ASSESSMENT AND PLAN:  Discussed the following assessment and plan:  PREVENTIVE EXAM: -Discussed and advised all [place] preventive services health task force level A and B recommendations for age, sex and risks. -Advised at least 150 minutes of exercise per week and a healthy diet with avoidance of (less then 1 serving per week) processed foods, white starches, red meat, fast foods and sweets and consisting of: *  5-9 servings of fresh fruits and vegetables (not corn or potatoes) *nuts and seeds, beans *olives and olive oil *lean meats such as fish and white chicken  *whole grains -labs, studies and vaccines per orders this encounter -labs from work to scan box reviewed  2. Essential hypertension -stable, continue antihypertensive - Basic metabolic panel - CBC  3. Tobacco use disorder -smoking cessation counseling 3-5 minutes -she opted to try nicotine patches, rx sent -advised lung ca screening - declined  4. Stress, depression -see phq9, mild -she declined medication, agrees to consider cbt - brochure provided  Patient advised to return to clinic immediately  if symptoms worsen or persist or new concerns.  Patient Instructions  BEFORE YOU LEAVE: -labs -see if she did DEXA with gyn? -follow up: 3-4 months  Stop smoking: -stop smoking and get rid of cigarettes -instead use the nicotine patches: -14mg  patch for 6 weeks, then -7mg  patch for 2 weeks  We have ordered labs or studies at this visit. It can take up to 1-2 weeks for results and processing. IF results require follow up or explanation, we will call you with instructions. Clinically stable results will be released to your [facility]. If you have not heard from [place] or cannot find your results in [facility] in 2 weeks please contact our office at [phone].  If you are not yet signed up for [facility], please consider signing up.  See the dermatologist yearly.  Call to set up Cognitive Behavioral Therapy for stress   We recommend the following healthy lifestyle for LIFE: 1) Small portions. But, make sure to get regular (at least 3 per day), healthy meals and small healthy snacks if needed.  2) Eat a healthy clean diet.   TRY TO EAT: -at least 5-7 servings of low sugar, colorful, and nutrient rich vegetables per day (not corn, potatoes or bananas.) -berries are the best choice if you wish to eat fruit (only eat small amounts if  trying to reduce weight)  -lean meets (fish, white meat of chicken or [place]) -vegan proteins for some meals - beans or tofu, whole grains, nuts and seeds -Replace bad fats with good fats - good fats include: fish, nuts and seeds, canola oil, olive oil -small amounts of low fat or non fat dairy -small amounts of100 % whole grains - check the lables -drink plenty of water  AVOID: -SUGAR, sweets, anything with added sugar, corn syrup or sweeteners - must read labels as even foods advertised as "healthy" often are loaded with sugar -if you must have a sweetener, small amounts of stevia may be best -sweetened beverages and artificially sweetened beverages -simple starches (rice, bread, potatoes, pasta, chips, etc - small amounts of 100% whole grains are ok) -red meat, pork, butter -fried foods, fast food, processed food, excessive dairy, eggs and coconut.  3)Get at least 150 minutes of sweaty aerobic exercise per week.  4)Reduce stress - consider counseling, meditation and relaxation to balance other aspects of your life.           No follow-ups on file.  Lucretia Kern, DO

## 2017-12-25 NOTE — Patient Instructions (Addendum)
BEFORE YOU LEAVE: -labs -see if she did DEXA with gyn? -follow up: 3-4 months  Stop smoking: -stop smoking and get rid of cigarettes -instead use the nicotine patches: -14mg  patch for 6 weeks, then -7mg  patch for 2 weeks  We have ordered labs or studies at this visit. It can take up to 1-2 weeks for results and processing. IF results require follow up or explanation, we will call you with instructions. Clinically stable results will be released to your [facility]. If you have not heard from [place] or cannot find your results in [facility] in 2 weeks please contact our office at [phone].  If you are not yet signed up for [facility], please consider signing up.  See the dermatologist yearly.  Call to set up Cognitive Behavioral Therapy for stress   We recommend the following healthy lifestyle for LIFE: 1) Small portions. But, make sure to get regular (at least 3 per day), healthy meals and small healthy snacks if needed.  2) Eat a healthy clean diet.   TRY TO EAT: -at least 5-7 servings of low sugar, colorful, and nutrient rich vegetables per day (not corn, potatoes or bananas.) -berries are the best choice if you wish to eat fruit (only eat small amounts if trying to reduce weight)  -lean meets (fish, white meat of chicken or [place]) -vegan proteins for some meals - beans or tofu, whole grains, nuts and seeds -Replace bad fats with good fats - good fats include: fish, nuts and seeds, canola oil, olive oil -small amounts of low fat or non fat dairy -small amounts of100 % whole grains - check the lables -drink plenty of water  AVOID: -SUGAR, sweets, anything with added sugar, corn syrup or sweeteners - must read labels as even foods advertised as "healthy" often are loaded with sugar -if you must have a sweetener, small amounts of stevia may be best -sweetened beverages and artificially sweetened beverages -simple starches (rice, bread, potatoes, pasta, chips, etc - small amounts of 100%  whole grains are ok) -red meat, pork, butter -fried foods, fast food, processed food, excessive dairy, eggs and coconut.  3)Get at least 150 minutes of sweaty aerobic exercise per week.  4)Reduce stress - consider counseling, meditation and relaxation to balance other aspects of your life.

## 2017-12-26 LAB — BASIC METABOLIC PANEL
BUN: 21 mg/dL (ref 6–23)
CO2: 29 mEq/L (ref 19–32)
Calcium: 9.5 mg/dL (ref 8.4–10.5)
Chloride: 106 mEq/L (ref 96–112)
Creatinine, Ser: 0.83 mg/dL (ref 0.40–1.20)
GFR: 73.7 mL/min (ref >60.00)
Glucose, Bld: 78 mg/dL (ref 70–99)
Potassium: 4.4 mEq/L (ref 3.5–5.1)
Sodium: 142 mEq/L (ref 135–145)

## 2017-12-26 LAB — CBC
HCT: 43.1 % (ref 36.0–46.0)
Hemoglobin: 14.8 g/dL (ref 12.0–15.0)
MCHC: 34.3 g/dL (ref 30.0–36.0)
MCV: 95.3 fl (ref 78.0–100.0)
Platelets: 275 10*3/uL (ref 150.0–400.0)
RBC: 4.52 Mil/uL (ref 3.87–5.11)
RDW: 12.8 % (ref 11.5–15.5)
WBC: 7.3 10*3/uL (ref 4.0–10.5)

## 2017-12-28 ENCOUNTER — Other Ambulatory Visit: Payer: Self-pay | Admitting: Family Medicine

## 2018-04-30 ENCOUNTER — Ambulatory Visit (INDEPENDENT_AMBULATORY_CARE_PROVIDER_SITE_OTHER): Payer: BLUE CROSS/BLUE SHIELD | Admitting: Family Medicine

## 2018-04-30 ENCOUNTER — Encounter: Payer: Self-pay | Admitting: Family Medicine

## 2018-04-30 VITALS — BP 118/80 | HR 96 | Temp 98.2°F | Ht 60.0 in | Wt 125.0 lb

## 2018-04-30 DIAGNOSIS — I10 Essential (primary) hypertension: Secondary | ICD-10-CM

## 2018-04-30 DIAGNOSIS — F339 Major depressive disorder, recurrent, unspecified: Secondary | ICD-10-CM

## 2018-04-30 DIAGNOSIS — F17200 Nicotine dependence, unspecified, uncomplicated: Secondary | ICD-10-CM | POA: Diagnosis not present

## 2018-04-30 DIAGNOSIS — M8580 Other specified disorders of bone density and structure, unspecified site: Secondary | ICD-10-CM

## 2018-04-30 NOTE — Progress Notes (Signed)
HPI:  Using dictation device. Unfortunately this device frequently misinterprets words/phrases.  Amanda Moss is a pleasant 62 y.o. here for follow up. Chronic medical problems summarized below were reviewed for changes and stability and were updated as needed below. These issues and their treatment remain stable for the most part. She agreed to try nicotine patches for smoking cessation at her last visit. She is doing well for the most part.  She did not quit smoking it.  She does still have the nicotine patches and plans to quit next week.  She was going to wait until she returns from a beach trip.  She is recovering from a cold.  She found out that she has osteopenia when she did her DEXA with her gynecologist.  He recommended that she start Fosamax.  She would prefer to avoid medications.  She is concerned about side effects.  Continues to have a lot of stress at work.  Depression improved with exercise.  Still mild depression and anxiety.  See PHQ 9.  Denies CP, SOB, DOE, treatment intolerance or new symptoms.  HTN: -meds: norvasc 5 mg  Hx Tobacco use: -refused lung cancer screening in the past, refuses again today -had quit smoking at appt 06/2016 -now back to 1/2 ppd, enjoys, but also wants to quit -did not like chantix  Stress/anxiety/depression: -mild -work stress related -no severe symptoms, no thoughts of harm   ROS: See pertinent positives and negatives per HPI.  Past Medical History:  Diagnosis Date  . Bleeding 2003   hemorrhoidal bleeding  . Hypertension   . Squamous cell cancer of skin of nose    per patient treated by Dr Lucia Gaskins  . Tobacco use   . Vitamin D deficiency     Past Surgical History:  Procedure Laterality Date  . CESAREAN SECTION    . cosmectic surgery     eyes  . RHINOPLASTY      Family History  Problem Relation Age of Onset  . Pneumonia Mother 24  . Cancer Father 70       lung  . Hypertension Father   . Diabetes Neg Hx     SOCIAL HX:  See HPI, continues to smoke   Current Outpatient Medications:  .  amLODipine (NORVASC) 5 MG tablet, TAKE 1 AND 1/2 TABLETS BY MOUTH ONCE DAILY, Disp: 135 tablet, Rfl: 1 .  Cholecalciferol (VITAMIN D PO), Take by mouth., Disp: , Rfl:  .  fluticasone (FLONASE) 50 MCG/ACT nasal spray, Place into both nostrils daily., Disp: , Rfl:  .  Multiple Vitamins-Minerals (PRESERVISION AREDS PO), Take by mouth., Disp: , Rfl:  .  nicotine (NICODERM CQ - DOSED IN MG/24 HOURS) 14 mg/24hr patch, Place 1 patch (14 mg total) onto the skin daily., Disp: 42 patch, Rfl: 0 .  nicotine (NICODERM CQ - DOSED IN MG/24 HR) 7 mg/24hr patch, Place 1 patch (7 mg total) onto the skin daily., Disp: 14 patch, Rfl: 0  EXAM:  Vitals:   04/30/18 1600  BP: 118/80  Pulse: 96  Temp: 98.2 F (36.8 C)    Body mass index is 24.41 kg/m.  GENERAL: vitals reviewed and listed above, alert, oriented, appears well hydrated and in no acute distress  HEENT: atraumatic, conjunttiva clear, no obvious abnormalities on inspection of external nose and ears  NECK: no obvious masses on inspection  LUNGS: clear to auscultation bilaterally, no wheezes, rales or rhonchi, good air movement  CV: HRRR, no peripheral edema  MS: moves all extremities without noticeable  abnormality  PSYCH: pleasant and cooperative, no obvious depression or anxiety  ASSESSMENT AND PLAN:  Discussed the following assessment and plan:  Essential hypertension  Tobacco use disorder  Depression, recurrent (HCC)  Osteopenia, unspecified location  -Smoking cessation counseling around 5 minutes, recommended to quit, she has nicotine patches to assist -She is going to consider seeing Dennison Bulla for help with the stress, smoking cessation mild depression.  Depression has improved with exercise. -Discussed various treatment options and risk the treatment of osteopenia.  She has decided to not take medication.  She sees her gynecologist about this. -Patient  advised to return or notify a doctor immediately if symptoms worsen or persist or new concerns arise.  Patient Instructions  BEFORE YOU LEAVE: -PHQ 9 -follow up: 3 months  Start the nicotine patches.  Please quit smoking!  Vitamin D3 1000 international units daily.  Regular weightbearing exercise.  Follow-up with your gynecologist for further discussion of options for the osteopenia.  Quit smoking.  Consider seeing Dennison Bulla for the stress.  He is great!             Lucretia Kern, DO

## 2018-04-30 NOTE — Patient Instructions (Addendum)
BEFORE YOU LEAVE: -PHQ 9 -follow up: 3 months  Start the nicotine patches.  Please quit smoking!  Vitamin D3 1000 international units daily.  Regular weightbearing exercise.  Follow-up with your gynecologist for further discussion of options for the osteopenia.  Quit smoking.  Consider seeing Dennison Bulla for the stress.  He is great!

## 2018-05-25 ENCOUNTER — Encounter: Payer: Self-pay | Admitting: Family Medicine

## 2018-05-25 LAB — HM COLONOSCOPY

## 2018-06-18 ENCOUNTER — Encounter: Payer: Self-pay | Admitting: Family Medicine

## 2018-07-01 ENCOUNTER — Other Ambulatory Visit: Payer: Self-pay | Admitting: Family Medicine

## 2018-08-10 ENCOUNTER — Ambulatory Visit: Payer: BLUE CROSS/BLUE SHIELD | Admitting: Family Medicine

## 2018-08-11 NOTE — Progress Notes (Signed)
HPI:  Using dictation device. Unfortunately this device frequently misinterprets words/phrases.  Amanda Moss is a pleasant 63 y.o. here for follow up. Chronic medical problems summarized below were reviewed for changes and stability and were updated as needed below. These issues and their treatment remain stable for the most part.  Reports is doing well for the most part. Seeing gyn for low bone density and now on boniva. Wants to know what else she can do for this.Continues to smoke, but wants to give it up for lent. Does not like meds Denies CP, SOB, DOE, treatment intolerance or new symptoms. Due for labs - lipids last done 08/2017 with LDL very mildly elevated  HTN: -meds: norvasc 5 mg  Hx Tobacco use: -refused lung cancer screening in the past, refuses again today -hadquit smoking at appt 06/2016 -now back to 1/2 ppd, enjoys, but also wants to quit -did not like chantix  Stress/anxiety/depression: -mild -work stress related -no severe symptoms, no thoughts of harm  ROS: See pertinent positives and negatives per HPI.  Past Medical History:  Diagnosis Date  . Bleeding 2003   hemorrhoidal bleeding  . Hypertension   . Squamous cell cancer of skin of nose    per patient treated by Dr Lucia Gaskins  . Tobacco use   . Vitamin D deficiency     Past Surgical History:  Procedure Laterality Date  . CESAREAN SECTION    . cosmectic surgery     eyes  . RHINOPLASTY      Family History  Problem Relation Age of Onset  . Pneumonia Mother 72  . Cancer Father 47       lung  . Hypertension Father   . Diabetes Neg Hx     SOCIAL HX: see hpi   Current Outpatient Medications:  .  amLODipine (NORVASC) 5 MG tablet, TAKE 1 AND 1/2 TABLETS BY MOUTH ONCE DAILY, Disp: 135 tablet, Rfl: 1 .  Cholecalciferol (VITAMIN D PO), Take by mouth., Disp: , Rfl:  .  fluticasone (FLONASE) 50 MCG/ACT nasal spray, Place into both nostrils daily., Disp: , Rfl:  .  Ibandronate Sodium (BONIVA PO), Take  by mouth., Disp: , Rfl:  .  Multiple Vitamins-Minerals (PRESERVISION AREDS PO), Take by mouth., Disp: , Rfl:  .  nicotine (NICODERM CQ - DOSED IN MG/24 HOURS) 14 mg/24hr patch, Place 1 patch (14 mg total) onto the skin daily., Disp: 42 patch, Rfl: 0 .  nicotine (NICODERM CQ - DOSED IN MG/24 HR) 7 mg/24hr patch, Place 1 patch (7 mg total) onto the skin daily., Disp: 14 patch, Rfl: 0  EXAM:  Vitals:   08/13/18 0748  BP: 122/78  Pulse: 72  Temp: 98 F (36.7 C)    Body mass index is 25.04 kg/m.  GENERAL: vitals reviewed and listed above, alert, oriented, appears well hydrated and in no acute distress  HEENT: atraumatic, conjunttiva clear, no obvious abnormalities on inspection of external nose and ears  NECK: no obvious masses on inspection  LUNGS: clear to auscultation bilaterally, no wheezes, rales or rhonchi, good air movement  CV: HRRR, no peripheral edema  MS: moves all extremities without noticeable abnormality  PSYCH: pleasant and cooperative, no obvious depression or anxiety  ASSESSMENT AND PLAN:  Discussed the following assessment and plan:  Essential hypertension - Plan: Basic metabolic panel, CBC  Depression, recurrent (HCC)  Tobacco use disorder  Low bone density  -labs per orders -DBP a little elevated, discussed options for management and advised smoking cessation, healthy diet,  exercise -discussed implications and treatment options for low bone density - she is seeing gyn for this, advised healthy diet, smoking cessation, wt bearing exercise, vit D3 - adequate cal from diet -CPE as scheduled, follow up sooner as needed   Patient Instructions  BEFORE YOU LEAVE: -labs -follow up: as scheduled for physical  Quit smoking! You can!  Eat healthy diet and get regular weight bearing exercise.  We have ordered labs or studies at this visit. It can take up to 1-2 weeks for results and processing. IF results require follow up or explanation, we will call you  with instructions. Clinically stable results will be released to your [facility]. If you have not heard from [place] or cannot find your results in [facility] in 2 weeks please contact our office at [phone].  If you are not yet signed up for [facility], please consider signing up.           Lucretia Kern, DO

## 2018-08-13 ENCOUNTER — Encounter: Payer: Self-pay | Admitting: Family Medicine

## 2018-08-13 ENCOUNTER — Ambulatory Visit: Payer: BLUE CROSS/BLUE SHIELD | Admitting: Family Medicine

## 2018-08-13 VITALS — BP 122/78 | HR 72 | Temp 98.0°F | Ht 60.0 in | Wt 128.2 lb

## 2018-08-13 DIAGNOSIS — I10 Essential (primary) hypertension: Secondary | ICD-10-CM | POA: Diagnosis not present

## 2018-08-13 DIAGNOSIS — F17200 Nicotine dependence, unspecified, uncomplicated: Secondary | ICD-10-CM | POA: Diagnosis not present

## 2018-08-13 DIAGNOSIS — F339 Major depressive disorder, recurrent, unspecified: Secondary | ICD-10-CM

## 2018-08-13 DIAGNOSIS — M8580 Other specified disorders of bone density and structure, unspecified site: Secondary | ICD-10-CM

## 2018-08-13 DIAGNOSIS — M859 Disorder of bone density and structure, unspecified: Secondary | ICD-10-CM

## 2018-08-13 LAB — CBC
HCT: 45.3 % (ref 36.0–46.0)
Hemoglobin: 15.8 g/dL — ABNORMAL HIGH (ref 12.0–15.0)
MCHC: 34.8 g/dL (ref 30.0–36.0)
MCV: 93.7 fl (ref 78.0–100.0)
Platelets: 244 10*3/uL (ref 150.0–400.0)
RBC: 4.84 Mil/uL (ref 3.87–5.11)
RDW: 12.8 % (ref 11.5–15.5)
WBC: 6.9 10*3/uL (ref 4.0–10.5)

## 2018-08-13 LAB — BASIC METABOLIC PANEL
BUN: 13 mg/dL (ref 6–23)
CO2: 30 mEq/L (ref 19–32)
Calcium: 9.4 mg/dL (ref 8.4–10.5)
Chloride: 103 mEq/L (ref 96–112)
Creatinine, Ser: 0.68 mg/dL (ref 0.40–1.20)
GFR: 87.1 mL/min (ref >60.00)
Glucose, Bld: 90 mg/dL (ref 70–99)
Potassium: 4.9 mEq/L (ref 3.5–5.1)
Sodium: 140 mEq/L (ref 135–145)

## 2018-08-13 NOTE — Patient Instructions (Signed)
BEFORE YOU LEAVE: -labs -follow up: as scheduled for physical  Quit smoking! You can!  Eat healthy diet and get regular weight bearing exercise.  We have ordered labs or studies at this visit. It can take up to 1-2 weeks for results and processing. IF results require follow up or explanation, we will call you with instructions. Clinically stable results will be released to your [facility]. If you have not heard from [place] or cannot find your results in [facility] in 2 weeks please contact our office at [phone].  If you are not yet signed up for [facility], please consider signing up.

## 2018-12-25 ENCOUNTER — Telehealth: Payer: Self-pay | Admitting: Family Medicine

## 2018-12-25 NOTE — Telephone Encounter (Signed)
[name] for the patietn to contact the office to cancel her physical with Dr. Maudie Mercury on 12/31/2018 and needs to schedule a TOC.

## 2018-12-27 ENCOUNTER — Other Ambulatory Visit: Payer: Self-pay | Admitting: Family Medicine

## 2018-12-30 NOTE — Telephone Encounter (Signed)
Pt needs a refill on amlodipine . [facility] [address] . Pt will make TOC with dr Ethlyn Gallery

## 2018-12-31 ENCOUNTER — Encounter: Payer: BLUE CROSS/BLUE SHIELD | Admitting: Family Medicine

## 2019-02-19 ENCOUNTER — Encounter: Payer: Self-pay | Admitting: Family Medicine

## 2019-02-19 ENCOUNTER — Ambulatory Visit (INDEPENDENT_AMBULATORY_CARE_PROVIDER_SITE_OTHER): Payer: BC Managed Care – PPO | Admitting: Family Medicine

## 2019-02-19 ENCOUNTER — Other Ambulatory Visit: Payer: Self-pay

## 2019-02-19 VITALS — Temp 96.5°F | Wt 124.0 lb

## 2019-02-19 DIAGNOSIS — E559 Vitamin D deficiency, unspecified: Secondary | ICD-10-CM

## 2019-02-19 DIAGNOSIS — Z13220 Encounter for screening for lipoid disorders: Secondary | ICD-10-CM

## 2019-02-19 DIAGNOSIS — I10 Essential (primary) hypertension: Secondary | ICD-10-CM | POA: Diagnosis not present

## 2019-02-19 DIAGNOSIS — M8580 Other specified disorders of bone density and structure, unspecified site: Secondary | ICD-10-CM | POA: Diagnosis not present

## 2019-02-19 NOTE — Progress Notes (Signed)
Virtual Visit via Video Note  I connected with Amanda Moss   on 02/19/19 at 11:30 AM EDT by a video enabled telemedicine application and verified that I am speaking with the correct person using two identifiers.  Location patient: home Location provider:work office Persons participating in the virtual visit: patient, provider  I discussed the limitations of evaluation and management by telemedicine and the availability of in person appointments. The patient expressed understanding and agreed to proceed.   Amanda Moss DOB: 1956/02/16 Encounter date: 02/19/2019  This is a 63 y.o. female who presents to establish care. Chief Complaint  Patient presents with  . 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 Getting seen at [facility]. Has follow up next Friday. Squamous cell carcinoma. Mohs excision. On keflex through tomorrow postoperatively.  [id number] 7.5mg  daily. Doesn't check at home, but last was 123/71.   Osteopenia: taking vitamin D (going to restart since surgery). Last bone density study was November of last year. Really does not want to take medications for bone density. Hoping that since she quit smoking she will have improvement.   Follows with gyn: Dr. Matthew Saras. Had mammogram and bone density done both in November.   Has had some anxiety with her recent procedure which has affected sleep. Does feel like there has been some improvement from where she was initially.   She is not exercising on regular basis. Was walking. Right now has weight lifting restrictions.   Past Medical History:  Diagnosis Date  . Bleeding 2003   hemorrhoidal bleeding  . Hypertension   . Squamous cell cancer of skin of nose    per patient treated by Dr Lucia Gaskins  . THUMB PAIN, RIGHT 06/20/2010   Qualifier: Diagnosis of  By: Elizebeth Koller MD, Mora Appl    . Tobacco use   . Tobacco use disorder 09/21/2014  . Vitamin D deficiency    Past  Surgical History:  Procedure Laterality Date  . CESAREAN SECTION    . COSMETIC SURGERY  2001   eyes  . NASAL RECONSTRUCTION    . RHINOPLASTY    . SQUAMOUS CELL CARCINOMA EXCISION     on nose; require reconstruction   Allergies  Allergen Reactions  . Codeine Sulfate     REACTION: nausea   Current Meds  Medication Sig  . amLODipine (NORVASC) 5 MG tablet TAKE 1 AND 1/2 TABLETS BY MOUTH ONCE DAILY  . Cholecalciferol (VITAMIN D PO) Take by mouth.  . fluticasone (FLONASE) 50 MCG/ACT nasal spray Place into both nostrils daily.  . Multiple Vitamins-Minerals (PRESERVISION AREDS PO) Take by mouth.   Social History   Tobacco Use  . Smoking status: Former Smoker    Packs/day: 0.50    Types: Cigarettes    Quit date: 08/2018    Years since quitting: 0.5  . Smokeless tobacco: Never Used  Substance Use Topics  . Alcohol use: Yes    Alcohol/week: 0.0 standard drinks    Comment: 2-3 drinks a few days per week   Family History  Problem Relation Age of Onset  . Dementia Mother   . Cancer Father 46       lung  . Hypertension Father   . Pneumonia Father 70  . Prostate cancer Brother   . Diabetes Neg Hx      Review of Systems  Constitutional: Negative for chills, fatigue and fever.  Respiratory: Negative for cough, chest tightness, shortness of breath  and wheezing.   Cardiovascular: Negative for chest pain, palpitations and leg swelling.    Objective:  Temp (!) 96.5 F (35.8 C) Comment: taken by pt-jaf  Wt 124 lb (56.2 kg) Comment: taken by pt-jaf  BMI 24.22 kg/m   Weight: 124 lb (56.2 kg)(taken by pt-jaf)   BP Readings from Last 3 Encounters:  08/13/18 122/78  04/30/18 118/80  12/25/17 120/80   Wt Readings from Last 3 Encounters:  02/19/19 124 lb (56.2 kg)  08/13/18 128 lb 3.2 oz (58.2 kg)  04/30/18 125 lb (56.7 kg)    EXAM:  GENERAL: alert, oriented, appears well and in no acute distress  HEENT: atraumatic, conjunctiva clear, no obvious abnormalities on  inspection of external nose and ears  NECK: normal movements of the head and neck  LUNGS: on inspection no signs of respiratory distress, breathing rate appears normal, no obvious gross SOB, gasping or wheezing  CV: no obvious cyanosis  MS: moves all visible extremities without noticeable abnormality  PSYCH/NEURO: pleasant and cooperative, no obvious depression or anxiety, speech and thought processing grossly intact  SKIN: she has bandage over forehead where they are doing reconstructive flap. Healing wound nostril, right nares.   Assessment/Plan 1. Essential hypertension Well-controlled.  Continue current medication.  2. Osteopenia, unspecified location We discussed importance of weightbearing exercise.  She does not want to take medication for bone density, so weightbearing exercise in addition to vitamin D and ample calcium in the diet is very important.  3. Vitamin D deficiency See above.  Continue current vitamin D supplementation.  Return for physical exam in next 3 mo.      I discussed the assessment and treatment plan with the patient. The patient was provided an opportunity to ask questions and all were answered. The patient agreed with the plan and demonstrated an understanding of the instructions.   The patient was advised to call back or seek an in-person evaluation if the symptoms worsen or if the condition fails to improve as anticipated.  I provided 25 minutes of non-face-to-face time during this encounter.   Micheline Rough, MD

## 2019-02-20 ENCOUNTER — Encounter: Payer: Self-pay | Admitting: Family Medicine

## 2019-02-23 ENCOUNTER — Telehealth: Payer: Self-pay | Admitting: *Deleted

## 2019-02-23 NOTE — Telephone Encounter (Signed)
-----   Message from Caren Macadam, MD sent at 02/20/2019  5:16 PM EDT ----- Set up physical for end of year (maybe November-December)

## 2019-02-23 NOTE — Telephone Encounter (Signed)
Left a detailed message at the pts cell number to schedule an appt as below.

## 2019-05-30 NOTE — Progress Notes (Signed)
Amanda Moss DOB: [birth year] Encounter date: 05/31/2019  This is a 63 y.o. 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 Getting seen at [facility]. Has follow up next Friday. Squamous cell carcinoma. Mohs excision.  Finished radiation 2 weeks ago so nose is still "peely". Went to [employer] yesterday which was mistake - really flared up nose. Tenderness is getting better. Feeling is coming back but starting to feel more now.   [id number] 7.5mg  daily. Not checking at home.   Osteopenia: Last bone density study was November of last year. Really does not want to take medications for bone density. Hoping that since she quit smoking she will have improvement.   Follows with gyn: Dr. Matthew Saras. Had mammogram and bone density done both in November.   Still has some minor panic attacks every now and then. Last night hard to go to sleep. Walking desk treadmill. Sleep has not been issue in past.   Last colonoscopy 05/2018: repeat in 5 years (hyperplastic)  Past Medical History:  Diagnosis Date  . Bleeding 2003   hemorrhoidal bleeding  . Hypertension   . Squamous cell cancer of skin of nose    per patient treated by Dr Lucia Gaskins  . THUMB PAIN, RIGHT 06/20/2010   Qualifier: Diagnosis of  By: Elizebeth Koller MD, Mora Appl    . Tobacco use   . Tobacco use disorder 09/21/2014  . Vitamin D deficiency    Past Surgical History:  Procedure Laterality Date  . CESAREAN SECTION    . COSMETIC SURGERY  2001   eyes  . NASAL RECONSTRUCTION    . RHINOPLASTY    . SQUAMOUS CELL CARCINOMA EXCISION     on nose; require reconstruction   Allergies  Allergen Reactions  . Codeine Sulfate     REACTION: nausea   Current Meds  Medication Sig  . amLODipine (NORVASC) 5 MG tablet TAKE 1 AND 1/2 TABLETS BY MOUTH ONCE DAILY  . Cholecalciferol (VITAMIN D PO) Take by mouth.  . fexofenadine (ALLEGRA) 180 MG tablet  Take 180 mg by mouth daily.  . Multiple Vitamins-Minerals (PRESERVISION AREDS PO) Take by mouth.   Social History   Tobacco Use  . Smoking status: Former Smoker    Packs/day: 0.50    Types: Cigarettes    Quit date: 08/2018    Years since quitting: 0.7  . Smokeless tobacco: Never Used  Substance Use Topics  . Alcohol use: Yes    Alcohol/week: 0.0 standard drinks    Comment: 2-3 drinks a few days per week   Family History  Problem Relation Age of Onset  . Dementia Mother   . Cancer Father 59       lung  . Hypertension Father   . Pneumonia Father 26  . Prostate cancer Brother   . Diabetes Neg Hx      Review of Systems  Constitutional: Negative for activity change, appetite change, chills, fatigue, fever and unexpected weight change.  HENT: Negative for congestion, ear pain, hearing loss, sinus pressure, sinus pain, sore throat and trouble swallowing.   Eyes: Negative for pain and visual disturbance.  Respiratory: Negative for cough, chest tightness, shortness of breath and wheezing.   Cardiovascular: Negative for chest pain, palpitations and leg swelling.  Gastrointestinal: Negative for abdominal pain, blood in stool, constipation, diarrhea, nausea and vomiting.  Genitourinary: Negative for difficulty urinating and menstrual problem.  Musculoskeletal: Negative for arthralgias and back  pain.  Skin: Negative for rash.  Neurological: Negative for dizziness, weakness, numbness and headaches.  Hematological: Negative for adenopathy. Does not bruise/bleed easily.  Psychiatric/Behavioral: Negative for sleep disturbance and suicidal ideas. The patient is not nervous/anxious.     CBC:  Lab Results  Component Value Date   WBC 6.9 08/13/2018   HGB 15.8 (H) 08/13/2018   HCT 45.3 08/13/2018   MCHC 34.8 08/13/2018   RDW 12.8 08/13/2018   PLT 244.0 08/13/2018   CMP: Lab Results  Component Value Date   NA 140 08/13/2018   K 4.9 08/13/2018   CL 103 08/13/2018   CO2 30  08/13/2018   GLUCOSE 90 08/13/2018   BUN 13 08/13/2018   CREATININE 0.68 08/13/2018   CALCIUM 9.4 08/13/2018   PROT 7.6 03/02/2014   BILITOT 0.5 03/02/2014   ALKPHOS 91 03/02/2014   ALT 22 03/02/2014   AST 21 03/02/2014   LIPID: Lab Results  Component Value Date   CHOL 168 08/29/2015   TRIG 114.0 08/29/2015   HDL 49.80 08/29/2015   LDLCALC 96 08/29/2015    Objective:  BP 110/78 (BP Location: Left Arm, Patient Position: Sitting, Cuff Size: Normal)   Pulse 83   Temp (!) 97.2 F (36.2 C) (Temporal)   Ht 5' 0.5" (1.537 m)   Wt 129 lb 9.6 oz (58.8 kg)   SpO2 98%   BMI 24.89 kg/m   Weight: 129 lb 9.6 oz (58.8 kg)   BP Readings from Last 3 Encounters:  05/31/19 110/78  08/13/18 122/78  04/30/18 118/80   Wt Readings from Last 3 Encounters:  05/31/19 129 lb 9.6 oz (58.8 kg)  02/19/19 124 lb (56.2 kg)  08/13/18 128 lb 3.2 oz (58.2 kg)    Physical Exam Constitutional:      General: She is not in acute distress.    Appearance: She is well-developed.  HENT:     Head: Normocephalic and atraumatic.     Right Ear: External ear normal.     Left Ear: External ear normal.     Mouth/Throat:     Pharynx: No oropharyngeal exudate.  Eyes:     Conjunctiva/sclera: Conjunctivae normal.     Pupils: Pupils are equal, round, and reactive to light.  Neck:     Thyroid: No thyromegaly.  Cardiovascular:     Rate and Rhythm: Normal rate and regular rhythm.     Heart sounds: Normal heart sounds. No murmur. No friction rub. No gallop.   Pulmonary:     Effort: Pulmonary effort is normal.     Breath sounds: Normal breath sounds.  Abdominal:     General: Bowel sounds are normal. There is no distension.     Palpations: Abdomen is soft. There is no mass.     Tenderness: There is no abdominal tenderness. There is no guarding.     Hernia: No hernia is present.  Musculoskeletal:        General: No tenderness or deformity. Normal range of motion.     Cervical back: Normal range of motion  and neck supple.  Lymphadenopathy:     Cervical: No cervical adenopathy.  Skin:    General: Skin is warm and dry.     Findings: No rash.  Neurological:     Mental Status: She is alert and oriented to person, place, and time.     Deep Tendon Reflexes: Reflexes normal.     Reflex Scores:      Tricep reflexes are 2+ on the right side  and 2+ on the left side.      Bicep reflexes are 2+ on the right side and 2+ on the left side.      Brachioradialis reflexes are 2+ on the right side and 2+ on the left side.      Patellar reflexes are 2+ on the right side and 2+ on the left side. Psychiatric:        Speech: Speech normal.        Behavior: Behavior normal.        Thought Content: Thought content normal.     Assessment/Plan: There are no preventive care reminders to display for this patient. Health Maintenance reviewed.  1. Preventative health care Keep up with exercise. Up to date with preventative health care.   2. Essential hypertension Well controlled. Continue current amlodipine dose.   3. Vitamin D deficiency Continue with supplementation.    Return in about 6 months (around 11/29/2019) for Chronic condition visit.  Micheline Rough, MD

## 2019-05-31 ENCOUNTER — Other Ambulatory Visit: Payer: Self-pay

## 2019-05-31 ENCOUNTER — Encounter: Payer: Self-pay | Admitting: Family Medicine

## 2019-05-31 ENCOUNTER — Ambulatory Visit (INDEPENDENT_AMBULATORY_CARE_PROVIDER_SITE_OTHER): Payer: BC Managed Care – PPO | Admitting: Family Medicine

## 2019-05-31 VITALS — BP 110/78 | HR 83 | Temp 97.2°F | Ht 60.5 in | Wt 129.6 lb

## 2019-05-31 DIAGNOSIS — Z13220 Encounter for screening for lipoid disorders: Secondary | ICD-10-CM | POA: Diagnosis not present

## 2019-05-31 DIAGNOSIS — R739 Hyperglycemia, unspecified: Secondary | ICD-10-CM | POA: Diagnosis not present

## 2019-05-31 DIAGNOSIS — I10 Essential (primary) hypertension: Secondary | ICD-10-CM

## 2019-05-31 DIAGNOSIS — E559 Vitamin D deficiency, unspecified: Secondary | ICD-10-CM

## 2019-05-31 DIAGNOSIS — Z0000 Encounter for general adult medical examination without abnormal findings: Secondary | ICD-10-CM

## 2019-05-31 LAB — HEMOGLOBIN A1C: Hgb A1c MFr Bld: 5.4 % (ref 4.6–6.5)

## 2019-05-31 LAB — COMPREHENSIVE METABOLIC PANEL
ALT: 23 U/L (ref 0–35)
AST: 19 U/L (ref 0–37)
Albumin: 4.5 g/dL (ref 3.5–5.2)
Alkaline Phosphatase: 105 U/L (ref 39–117)
BUN: 12 mg/dL (ref 6–23)
CO2: 27 mEq/L (ref 19–32)
Calcium: 9.5 mg/dL (ref 8.4–10.5)
Chloride: 104 mEq/L (ref 96–112)
Creatinine, Ser: 0.62 mg/dL (ref 0.40–1.20)
GFR: 96.66 mL/min (ref >60.00)
Glucose, Bld: 102 mg/dL — ABNORMAL HIGH (ref 70–99)
Potassium: 4.2 mEq/L (ref 3.5–5.1)
Sodium: 141 mEq/L (ref 135–145)
Total Bilirubin: 0.5 mg/dL (ref 0.2–1.2)
Total Protein: 6.9 g/dL (ref 6.0–8.3)

## 2019-05-31 LAB — CBC WITH DIFFERENTIAL/PLATELET
Basophils Absolute: 0.1 10*3/uL (ref 0.0–0.1)
Basophils Relative: 0.8 % (ref 0.0–3.0)
Eosinophils Absolute: 0.1 10*3/uL (ref 0.0–0.7)
Eosinophils Relative: 1.7 % (ref 0.0–5.0)
HCT: 44.6 % (ref 36.0–46.0)
Hemoglobin: 15.2 g/dL — ABNORMAL HIGH (ref 12.0–15.0)
Lymphocytes Relative: 18.7 % (ref 12.0–46.0)
Lymphs Abs: 1.4 10*3/uL (ref 0.7–4.0)
MCHC: 34 g/dL (ref 30.0–36.0)
MCV: 94.8 fl (ref 78.0–100.0)
Monocytes Absolute: 0.5 10*3/uL (ref 0.1–1.0)
Monocytes Relative: 7.5 % (ref 3.0–12.0)
Neutro Abs: 5.2 10*3/uL (ref 1.4–7.7)
Neutrophils Relative %: 71.3 % (ref 43.0–77.0)
Platelets: 258 10*3/uL (ref 150.0–400.0)
RBC: 4.71 Mil/uL (ref 3.87–5.11)
RDW: 13 % (ref 11.5–15.5)
WBC: 7.3 10*3/uL (ref 4.0–10.5)

## 2019-05-31 LAB — LIPID PANEL
Cholesterol: 194 mg/dL (ref 0–200)
HDL: 64.8 mg/dL (ref >39.00)
LDL Cholesterol: 105 mg/dL — ABNORMAL HIGH (ref 0–99)
NonHDL: 128.96
Total CHOL/HDL Ratio: 3
Triglycerides: 118 mg/dL (ref 0.0–149.0)
VLDL: 23.6 mg/dL (ref 0.0–40.0)

## 2019-05-31 LAB — VITAMIN D 25 HYDROXY (VIT D DEFICIENCY, FRACTURES): VITD: 31.58 ng/mL (ref 30.00–100.00)

## 2019-05-31 NOTE — Addendum Note (Signed)
Addended by: Isaiah Serge D on: 05/31/2019 03:14 PM   Modules accepted: Orders

## 2019-05-31 NOTE — Addendum Note (Signed)
Addended by: Isaiah Serge D on: 05/31/2019 09:09 AM   Modules accepted: Orders

## 2019-05-31 NOTE — Addendum Note (Signed)
Addended by: Isaiah Serge D on: 05/31/2019 03:10 PM   Modules accepted: Orders

## 2019-06-22 ENCOUNTER — Encounter: Payer: Self-pay | Admitting: Family Medicine

## 2019-06-22 DIAGNOSIS — Z85828 Personal history of other malignant neoplasm of skin: Secondary | ICD-10-CM

## 2019-06-30 ENCOUNTER — Telehealth: Payer: BC Managed Care – PPO | Admitting: Family Medicine

## 2019-09-07 DIAGNOSIS — H2513 Age-related nuclear cataract, bilateral: Secondary | ICD-10-CM | POA: Diagnosis not present

## 2019-09-07 DIAGNOSIS — H43811 Vitreous degeneration, right eye: Secondary | ICD-10-CM | POA: Diagnosis not present

## 2019-09-07 DIAGNOSIS — H35033 Hypertensive retinopathy, bilateral: Secondary | ICD-10-CM | POA: Diagnosis not present

## 2019-09-07 DIAGNOSIS — H353132 Nonexudative age-related macular degeneration, bilateral, intermediate dry stage: Secondary | ICD-10-CM | POA: Diagnosis not present

## 2019-10-05 DIAGNOSIS — C300 Malignant neoplasm of nasal cavity: Secondary | ICD-10-CM | POA: Diagnosis not present

## 2019-10-29 ENCOUNTER — Other Ambulatory Visit: Payer: Self-pay | Admitting: Family Medicine

## 2020-01-11 DIAGNOSIS — J3489 Other specified disorders of nose and nasal sinuses: Secondary | ICD-10-CM | POA: Diagnosis not present

## 2020-01-11 DIAGNOSIS — C300 Malignant neoplasm of nasal cavity: Secondary | ICD-10-CM | POA: Diagnosis not present

## 2020-01-24 ENCOUNTER — Other Ambulatory Visit: Payer: Self-pay | Admitting: Family Medicine

## 2020-02-09 ENCOUNTER — Encounter: Payer: Self-pay | Admitting: Family Medicine

## 2020-02-09 ENCOUNTER — Ambulatory Visit (INDEPENDENT_AMBULATORY_CARE_PROVIDER_SITE_OTHER): Payer: Medicare HMO | Admitting: Family Medicine

## 2020-02-09 ENCOUNTER — Other Ambulatory Visit: Payer: Self-pay

## 2020-02-09 VITALS — BP 120/78 | HR 86 | Temp 98.0°F | Ht 60.5 in | Wt 129.3 lb

## 2020-02-09 DIAGNOSIS — I10 Essential (primary) hypertension: Secondary | ICD-10-CM

## 2020-02-09 DIAGNOSIS — F419 Anxiety disorder, unspecified: Secondary | ICD-10-CM

## 2020-02-09 DIAGNOSIS — R69 Illness, unspecified: Secondary | ICD-10-CM | POA: Diagnosis not present

## 2020-02-09 DIAGNOSIS — E559 Vitamin D deficiency, unspecified: Secondary | ICD-10-CM

## 2020-02-09 MED ORDER — CITALOPRAM HYDROBROMIDE 10 MG PO TABS: 10.0000 mg | ORAL_TABLET | Freq: Every day | ORAL | 1 refills | Status: DC

## 2020-02-09 NOTE — Progress Notes (Signed)
Amanda Moss DOB: 1955-07-10 Encounter date: 02/09/2020  This is a 64 y.o. female who presents with Chief Complaint  Patient presents with  . Follow-up    History of present illness: Finding self angry more often. Getting more frustrated at work. Wanting referral to talk with someone. Has done therapy in past, but it was a long time ago. Hard to get motivated. Harder to find joy in things. Not sleeping as well. Usually goes to bed early and then waking up at 4. Sometimes feels rested.   Not exercising on regular basis.   Hypertension: Amlodipine 7.5 mg. Not checking at home. But is good about taking medication.  Osteopenia:taking vitamin D.  Follows with gynecology, Dr. Matthew Saras. Last colonoscopy 05/2018: repeat in 5 years (hyperplastic)  Follows q 3 months with cancer specialists.    Allergies  Allergen Reactions  . Codeine Sulfate     REACTION: nausea   Current Meds  Medication Sig  . amLODipine (NORVASC) 5 MG tablet TAKE 1 AND 1/2 TABLETS BY MOUTH ONCE DAILY  . Cholecalciferol (VITAMIN D PO) Take by mouth.  . Multiple Vitamins-Minerals (PRESERVISION AREDS PO) Take 1 tablet by mouth daily.     Review of Systems  Constitutional: Negative for chills, fatigue and fever.  Respiratory: Negative for cough, chest tightness, shortness of breath and wheezing.   Cardiovascular: Negative for chest pain, palpitations and leg swelling.  Psychiatric/Behavioral: Positive for sleep disturbance. The patient is nervous/anxious.     Objective:  BP 120/78 (BP Location: Left Arm, Patient Position: Sitting, Cuff Size: Normal)   Pulse 86   Temp 98 F (36.7 C) (Oral)   Ht 5' 0.5" (1.537 m)   Wt 129 lb 4.8 oz (58.7 kg)   SpO2 99%   BMI 24.84 kg/m   Weight: 129 lb 4.8 oz (58.7 kg)   BP Readings from Last 3 Encounters:  02/09/20 120/78  05/31/19 110/78  08/13/18 122/78   Wt Readings from Last 3 Encounters:  02/09/20 129 lb 4.8 oz (58.7 kg)  05/31/19 129 lb 9.6 oz (58.8 kg)   02/19/19 124 lb (56.2 kg)    Physical Exam Constitutional:      General: She is not in acute distress.    Appearance: She is well-developed.  Cardiovascular:     Rate and Rhythm: Normal rate and regular rhythm.     Heart sounds: Normal heart sounds. No murmur heard.  No friction rub.  Pulmonary:     Effort: Pulmonary effort is normal. No respiratory distress.     Breath sounds: Normal breath sounds. No wheezing or rales.  Musculoskeletal:     Right lower leg: No edema.     Left lower leg: No edema.  Neurological:     Mental Status: She is alert and oriented to person, place, and time.  Psychiatric:        Behavior: Behavior normal.    GAD 7 : Generalized Anxiety Score 02/09/2020  Nervous, Anxious, on Edge 3  Control/stop worrying 1  Worry too much - different things 0  Trouble relaxing 0  Restless 0  Easily annoyed or irritable 3  Afraid - awful might happen 2  Total GAD 7 Score 9  Anxiety Difficulty Somewhat difficult      Office Visit from 02/09/2020 in [place] [facility] [place]  PHQ-9 Total Score 8       Assessment/Plan    1. Essential hypertension Blood pressures well controlled with amlodipine.  Continue this medication.  2. Vitamin D deficiency Continue  replacement vitamin D.  Continue weightbearing exercise.  3. Anxiety After discussion of options, she has elected to start citalopram.  We discussed the benefits and risks of this medication.  She left before we could give her a hard copy of some suggested therapist in the area, but this information will be mailed to her.  She was also instructed she can look on the back of her insurance card to find providers for therapy.  I have asked her to update me in 2 weeks time after starting the medication if we can determine follow-up at that point.   Return for pending mychart update .    Micheline Rough, MD

## 2020-02-09 NOTE — Patient Instructions (Signed)
Update me in 2 weeks time if you start medication.

## 2020-02-23 DIAGNOSIS — Z124 Encounter for screening for malignant neoplasm of cervix: Secondary | ICD-10-CM | POA: Diagnosis not present

## 2020-02-23 DIAGNOSIS — Z779 Other contact with and (suspected) exposures hazardous to health: Secondary | ICD-10-CM | POA: Diagnosis not present

## 2020-02-23 DIAGNOSIS — Z1231 Encounter for screening mammogram for malignant neoplasm of breast: Secondary | ICD-10-CM | POA: Diagnosis not present

## 2020-02-23 DIAGNOSIS — Z6824 Body mass index (BMI) 24.0-24.9, adult: Secondary | ICD-10-CM | POA: Diagnosis not present

## 2020-03-24 ENCOUNTER — Encounter: Payer: Self-pay | Admitting: Family Medicine

## 2020-03-24 ENCOUNTER — Ambulatory Visit (INDEPENDENT_AMBULATORY_CARE_PROVIDER_SITE_OTHER): Payer: Medicare HMO | Admitting: Family Medicine

## 2020-03-24 ENCOUNTER — Other Ambulatory Visit: Payer: Self-pay

## 2020-03-24 VITALS — BP 140/90 | HR 89 | Temp 98.2°F | Ht 60.5 in | Wt 130.5 lb

## 2020-03-24 DIAGNOSIS — L820 Inflamed seborrheic keratosis: Secondary | ICD-10-CM

## 2020-03-24 NOTE — Progress Notes (Signed)
  Amanda Moss DOB: 26-Feb-1956 Encounter date: 03/24/2020  This is a 64 y.o. female who presents with Chief Complaint  Patient presents with  . Procedure    patient presents for mole excisions on the chest    History of present illness: Not checking pressures at home. Had stressful conversation for work right before coming in today.   Multiple seborrheic keratoses that bother her.  Mostly along bra line and are irritating/itchy.  Has had them for years and continues to get more.  Allergies  Allergen Reactions  . Codeine Sulfate     REACTION: nausea   Current Meds  Medication Sig  . amLODipine (NORVASC) 5 MG tablet TAKE 1 AND 1/2 TABLETS BY MOUTH ONCE DAILY  . Cholecalciferol (VITAMIN D PO) Take by mouth.  . citalopram (CELEXA) 10 MG tablet Take 1 tablet (10 mg total) by mouth daily.  . Multiple Vitamins-Minerals (PRESERVISION AREDS PO) Take 1 tablet by mouth daily.     Review of Systems  Constitutional: Negative for chills, fatigue and fever.  Respiratory: Negative for cough, chest tightness, shortness of breath and wheezing.   Cardiovascular: Negative for chest pain, palpitations and leg swelling.  Skin:       Skin lesions; see hpi     Objective:  BP 140/90 (BP Location: Left Arm, Patient Position: Sitting, Cuff Size: Normal)   Pulse 89   Temp 98.2 F (36.8 C) (Oral)   Ht 5' 0.5" (1.537 m)   Wt 130 lb 8 oz (59.2 kg)   BMI 25.07 kg/m   Weight: 130 lb 8 oz (59.2 kg)   BP Readings from Last 3 Encounters:  03/24/20 140/90  02/09/20 120/78  05/31/19 110/78   Wt Readings from Last 3 Encounters:  03/24/20 130 lb 8 oz (59.2 kg)  02/09/20 129 lb 4.8 oz (58.7 kg)  05/31/19 129 lb 9.6 oz (58.8 kg)    Physical Exam Constitutional:      Appearance: Normal appearance.  Pulmonary:     Effort: Pulmonary effort is normal.  Neurological:     Mental Status: She is alert.    Seborrheic keratosis, freezing Procedure Note  Pre-operative Diagnosis: seborrheic  keratosis  Post-operative Diagnosis: same  Locations: upper abd, mid torso, heavily dispersed under breasts bilat. 40 lesions frozen under bilat breasts, upper back, sides.    Procedure Details   Patient informed of the risks, including irritation, blistering.  Using liquid nitrogen, 3 cycles of freezing were done on scattered seborrheic keratoses on trunk.  Each was frozen long enough to get a 1 mm cone of freezing rim  Patient tolerated procedure well.  Complications: none.  Plan: 1. Instructed to keep wounds moist for best healing. Call if any concerns. Lesions may blister in next few days; ok for retreatment if not resolved in 2-3 weeks.    Assessment/Plan  1. Seborrheic keratoses, inflamed Frozen in office; retreat in 2-3 weeks if needed/desired. - PR DESTRUCTION BENIGN LESIONS 15 OR MORE   Return if symptoms worsen or fail to improve.     Micheline Rough, MD

## 2020-04-04 DIAGNOSIS — C300 Malignant neoplasm of nasal cavity: Secondary | ICD-10-CM | POA: Diagnosis not present

## 2020-04-18 ENCOUNTER — Other Ambulatory Visit: Payer: Self-pay | Admitting: Family Medicine

## 2020-05-24 DIAGNOSIS — C300 Malignant neoplasm of nasal cavity: Secondary | ICD-10-CM | POA: Diagnosis not present

## 2020-05-24 DIAGNOSIS — J3489 Other specified disorders of nose and nasal sinuses: Secondary | ICD-10-CM | POA: Diagnosis not present

## 2020-07-11 DIAGNOSIS — C300 Malignant neoplasm of nasal cavity: Secondary | ICD-10-CM | POA: Diagnosis not present

## 2020-07-11 DIAGNOSIS — J3489 Other specified disorders of nose and nasal sinuses: Secondary | ICD-10-CM | POA: Diagnosis not present

## 2020-07-23 ENCOUNTER — Other Ambulatory Visit: Payer: Self-pay | Admitting: Family Medicine

## 2020-09-05 ENCOUNTER — Other Ambulatory Visit: Payer: Self-pay

## 2020-09-06 ENCOUNTER — Ambulatory Visit (INDEPENDENT_AMBULATORY_CARE_PROVIDER_SITE_OTHER): Payer: Medicare HMO | Admitting: Family Medicine

## 2020-09-06 ENCOUNTER — Encounter: Payer: Self-pay | Admitting: Family Medicine

## 2020-09-06 VITALS — BP 118/60 | HR 91 | Temp 98.0°F | Ht 60.25 in | Wt 124.3 lb

## 2020-09-06 DIAGNOSIS — Z23 Encounter for immunization: Secondary | ICD-10-CM | POA: Diagnosis not present

## 2020-09-06 DIAGNOSIS — R0602 Shortness of breath: Secondary | ICD-10-CM

## 2020-09-06 DIAGNOSIS — E559 Vitamin D deficiency, unspecified: Secondary | ICD-10-CM

## 2020-09-06 DIAGNOSIS — Z87891 Personal history of nicotine dependence: Secondary | ICD-10-CM | POA: Diagnosis not present

## 2020-09-06 DIAGNOSIS — Z1231 Encounter for screening mammogram for malignant neoplasm of breast: Secondary | ICD-10-CM | POA: Diagnosis not present

## 2020-09-06 DIAGNOSIS — F419 Anxiety disorder, unspecified: Secondary | ICD-10-CM

## 2020-09-06 DIAGNOSIS — M8580 Other specified disorders of bone density and structure, unspecified site: Secondary | ICD-10-CM | POA: Diagnosis not present

## 2020-09-06 DIAGNOSIS — Z0000 Encounter for general adult medical examination without abnormal findings: Secondary | ICD-10-CM

## 2020-09-06 DIAGNOSIS — Z13220 Encounter for screening for lipoid disorders: Secondary | ICD-10-CM

## 2020-09-06 DIAGNOSIS — I10 Essential (primary) hypertension: Secondary | ICD-10-CM | POA: Diagnosis not present

## 2020-09-06 DIAGNOSIS — M25511 Pain in right shoulder: Secondary | ICD-10-CM

## 2020-09-06 DIAGNOSIS — R69 Illness, unspecified: Secondary | ICD-10-CM | POA: Diagnosis not present

## 2020-09-06 LAB — CBC WITH DIFFERENTIAL/PLATELET
Basophils Absolute: 0.1 10*3/uL (ref 0.0–0.1)
Basophils Relative: 0.4 % (ref 0.0–3.0)
Eosinophils Absolute: 0.1 10*3/uL (ref 0.0–0.7)
Eosinophils Relative: 0.6 % (ref 0.0–5.0)
HCT: 47.8 % — ABNORMAL HIGH (ref 36.0–46.0)
Hemoglobin: 16.1 g/dL — ABNORMAL HIGH (ref 12.0–15.0)
Lymphocytes Relative: 6.5 % — ABNORMAL LOW (ref 12.0–46.0)
Lymphs Abs: 1.2 10*3/uL (ref 0.7–4.0)
MCHC: 33.7 g/dL (ref 30.0–36.0)
MCV: 93.7 fl (ref 78.0–100.0)
Monocytes Absolute: 1.2 10*3/uL — ABNORMAL HIGH (ref 0.1–1.0)
Monocytes Relative: 6.5 % (ref 3.0–12.0)
Neutro Abs: 15.2 10*3/uL — ABNORMAL HIGH (ref 1.4–7.7)
Neutrophils Relative %: 86 % — ABNORMAL HIGH (ref 43.0–77.0)
Platelets: 264 10*3/uL (ref 150.0–400.0)
RBC: 5.1 Mil/uL (ref 3.87–5.11)
RDW: 12.9 % (ref 11.5–15.5)
WBC: 17.7 10*3/uL — ABNORMAL HIGH (ref 4.0–10.5)

## 2020-09-06 LAB — COMPREHENSIVE METABOLIC PANEL
ALT: 19 U/L (ref 0–35)
AST: 16 U/L (ref 0–37)
Albumin: 4.8 g/dL (ref 3.5–5.2)
Alkaline Phosphatase: 108 U/L (ref 39–117)
BUN: 22 mg/dL (ref 6–23)
CO2: 28 mEq/L (ref 19–32)
Calcium: 10.2 mg/dL (ref 8.4–10.5)
Chloride: 102 mEq/L (ref 96–112)
Creatinine, Ser: 0.7 mg/dL (ref 0.40–1.20)
GFR: 90.33 mL/min (ref >60.00)
Glucose, Bld: 104 mg/dL — ABNORMAL HIGH (ref 70–99)
Potassium: 4.8 mEq/L (ref 3.5–5.1)
Sodium: 140 mEq/L (ref 135–145)
Total Bilirubin: 0.8 mg/dL (ref 0.2–1.2)
Total Protein: 7.4 g/dL (ref 6.0–8.3)

## 2020-09-06 LAB — LIPID PANEL
Cholesterol: 198 mg/dL (ref 0–200)
HDL: 60.6 mg/dL (ref >39.00)
LDL Cholesterol: 113 mg/dL — ABNORMAL HIGH (ref 0–99)
NonHDL: 137.22
Total CHOL/HDL Ratio: 3
Triglycerides: 122 mg/dL (ref 0.0–149.0)
VLDL: 24.4 mg/dL (ref 0.0–40.0)

## 2020-09-06 LAB — VITAMIN D 25 HYDROXY (VIT D DEFICIENCY, FRACTURES): VITD: 44.5 ng/mL (ref 30.00–100.00)

## 2020-09-06 MED ORDER — SHINGRIX 50 MCG/0.5ML IM SUSR: 0.5000 mL | Freq: Once | INTRAMUSCULAR | 0 refills | Status: AC

## 2020-09-06 MED ORDER — CLONAZEPAM 0.5 MG PO TABS: 0.5000 mg | ORAL_TABLET | Freq: Every day | ORAL | 0 refills | Status: DC | PRN

## 2020-09-06 MED ORDER — MELOXICAM 7.5 MG PO TABS: 7.5000 mg | ORAL_TABLET | Freq: Every day | ORAL | 0 refills | Status: DC

## 2020-09-06 NOTE — Patient Instructions (Signed)
Let me know if shoulder is not better in 2 weeks time.

## 2020-09-06 NOTE — Progress Notes (Signed)
SERRIA SLOMA DOB: 19-Jul-1955 Encounter date: 09/06/2020  This is a 65 y.o. female who presents for complete physical   History of present illness/Additional concerns:  Thought she was getting a frozen shoulder. Has been doing yoga at [facility] and it has loosened up. Thinks from sitting at [facility]. Not sure if there is more she should be doing. Weakness in shoulder; pain anterior and if lifting up feels pain under arm. Came out of blue. Has hx of carpal tunnel bilat. No numbness/tingling. Little neck pain - feels muscle more on right side.   Sometimes when trying to do arobics she has hard time breathing. Worries with smoking history. Bothers her whole time she exercises - doing more stretching than aerobics. No chest pain/pressure. No cough. Does feel heart beating fast when she is doing that. May feel heart racing outside of exercise - like felt short of breath with taking full trash cans out; felt heart racing then. Not always winded with activity - sometimes does ok.   Doing barefoot cruise on [place] in May - all around [place].   Hypertension: Amlodipine 7.5 mg daily; checks on occasion. She was surprised with low numbers today - typically more like 120's/70-80.   Osteopenia: Managed by Gynecology (Dr. Matthew Saras). Saw them a year ago.   Vitamin D deficiency: she does take supplement at home 1000 units daily History of squamous cell cancer of the nose: Follows regularly with dermatology. Anxiety: Citalopram 10 mg daily was never started. Not sure if some of shortness of breath is anxiety.   Follows with Dr. Matthew Saras (gynecology): Last mammogram: last year 2021.  Last colonoscopy: 05/2018; 3 year repeat due to polyps  Past Medical History:  Diagnosis Date  . Bleeding 2003   hemorrhoidal bleeding  . Hypertension   . Squamous cell cancer of skin of nose    per patient treated by Dr Lucia Gaskins  . THUMB PAIN, RIGHT 06/20/2010   Qualifier: Diagnosis of  By: Elizebeth Koller MD, Mora Appl    . Tobacco use   .  Tobacco use disorder 09/21/2014  . Vitamin D deficiency    Past Surgical History:  Procedure Laterality Date  . CESAREAN SECTION    . COSMETIC SURGERY  2001   eyes  . NASAL RECONSTRUCTION    . RHINOPLASTY    . SQUAMOUS CELL CARCINOMA EXCISION     on nose; require reconstruction   Allergies  Allergen Reactions  . Codeine Sulfate     REACTION: nausea   Current Meds  Medication Sig  . amLODipine (NORVASC) 5 MG tablet TAKE 1 AND 1/2 TABLETS BY MOUTH ONCE DAILY  . budesonide (PULMICORT) 0.5 MG/2ML nebulizer solution Mix one ampule (1 dose) to 272mL of saline in rinse bottle; irrigate sinuses with 120 mL through each nostril daily. QTY: 30 day supply  . Cholecalciferol (VITAMIN D PO) Take by mouth.  . clonazePAM (KLONOPIN) 0.5 MG tablet Take 1 tablet (0.5 mg total) by mouth daily as needed for anxiety.  . meloxicam (MOBIC) 7.5 MG tablet Take 1 tablet (7.5 mg total) by mouth daily.  . Multiple Vitamins-Minerals (PRESERVISION AREDS PO) Take 1 tablet by mouth daily.   Marland Kitchen Zoster Vaccine Adjuvanted [facility]) injection Inject 0.5 mLs into the muscle once for 1 dose. Repeat in 2-6 months   Social History   Tobacco Use  . Smoking status: Former Smoker    Packs/day: 0.50    Years: 40.00    Pack years: 20.00    Types: Cigarettes    Quit  date: 08/2018    Years since quitting: 2.0  . Smokeless tobacco: Never Used  Substance Use Topics  . Alcohol use: Yes    Alcohol/week: 0.0 standard drinks    Comment: 2-3 drinks a few days per week   Family History  Problem Relation Age of Onset  . Dementia Mother   . Cancer Father 65       lung  . Hypertension Father   . Pneumonia Father 8  . Prostate cancer Brother   . Diabetes Neg Hx      Review of Systems  Constitutional: Negative for activity change, appetite change, chills, fatigue, fever and unexpected weight change.  HENT: Negative for congestion, ear pain, hearing loss, sinus pressure, sinus pain, sore throat and trouble swallowing.    Eyes: Negative for pain and visual disturbance.  Respiratory: Negative for cough, chest tightness, shortness of breath and wheezing.   Cardiovascular: Negative for chest pain, palpitations and leg swelling.  Gastrointestinal: Negative for abdominal pain, blood in stool, constipation, diarrhea, nausea and vomiting.  Genitourinary: Negative for difficulty urinating and menstrual problem.  Musculoskeletal: Negative for arthralgias and back pain.  Skin: Negative for rash.  Neurological: Negative for dizziness, weakness, numbness and headaches.  Hematological: Negative for adenopathy. Does not bruise/bleed easily.  Psychiatric/Behavioral: Negative for sleep disturbance and suicidal ideas. The patient is not nervous/anxious.     CBC:  Lab Results  Component Value Date   WBC 17.7 (H) 09/06/2020   HGB 16.1 (H) 09/06/2020   HCT 47.8 (H) 09/06/2020   MCHC 33.7 09/06/2020   RDW 12.9 09/06/2020   PLT 264.0 09/06/2020   CMP: Lab Results  Component Value Date   NA 140 09/06/2020   K 4.8 09/06/2020   CL 102 09/06/2020   CO2 28 09/06/2020   GLUCOSE 104 (H) 09/06/2020   BUN 22 09/06/2020   CREATININE 0.70 09/06/2020   CALCIUM 10.2 09/06/2020   PROT 7.4 09/06/2020   BILITOT 0.8 09/06/2020   ALKPHOS 108 09/06/2020   ALT 19 09/06/2020   AST 16 09/06/2020   LIPID: Lab Results  Component Value Date   CHOL 198 09/06/2020   TRIG 122.0 09/06/2020   HDL 60.60 09/06/2020   LDLCALC 113 (H) 09/06/2020    Objective:  BP 118/60 (BP Location: Left Arm, Patient Position: Sitting, Cuff Size: Normal)   Pulse 91   Temp 98 F (36.7 C) (Oral)   Ht 5' 0.25" (1.53 m)   Wt 124 lb 4.8 oz (56.4 kg)   SpO2 95%   BMI 24.07 kg/m   Weight: 124 lb 4.8 oz (56.4 kg)   BP Readings from Last 3 Encounters:  09/06/20 118/60  03/24/20 140/90  02/09/20 120/78   Wt Readings from Last 3 Encounters:  09/06/20 124 lb 4.8 oz (56.4 kg)  03/24/20 130 lb 8 oz (59.2 kg)  02/09/20 129 lb 4.8 oz (58.7 kg)     Physical Exam Constitutional:      General: She is not in acute distress.    Appearance: She is well-developed.  HENT:     Head: Normocephalic and atraumatic.     Right Ear: External ear normal.     Left Ear: External ear normal.     Mouth/Throat:     Pharynx: No oropharyngeal exudate.  Eyes:     Conjunctiva/sclera: Conjunctivae normal.     Pupils: Pupils are equal, round, and reactive to light.  Neck:     Thyroid: No thyromegaly.  Cardiovascular:     Rate and  Rhythm: Normal rate and regular rhythm.     Heart sounds: Normal heart sounds. No murmur heard. No friction rub. No gallop.   Pulmonary:     Effort: Pulmonary effort is normal.     Breath sounds: Normal breath sounds.  Abdominal:     General: Bowel sounds are normal. There is no distension.     Palpations: Abdomen is soft. There is no mass.     Tenderness: There is no abdominal tenderness. There is no guarding.     Hernia: No hernia is present.  Musculoskeletal:        General: No tenderness or deformity. Normal range of motion.     Cervical back: Normal range of motion and neck supple.     Comments: Pain with abduction over 90 degrees right shoulder; pain with supraspinatus testing, but no weakness. Mild discomfort with hawkins. Normal UE reflexes bilat. Otherwise strength intact.  Lymphadenopathy:     Cervical: No cervical adenopathy.  Skin:    General: Skin is warm and dry.     Findings: No rash.  Neurological:     Mental Status: She is alert and oriented to person, place, and time.     Deep Tendon Reflexes: Reflexes normal.     Reflex Scores:      Tricep reflexes are 2+ on the right side and 2+ on the left side.      Bicep reflexes are 2+ on the right side and 2+ on the left side.      Brachioradialis reflexes are 2+ on the right side and 2+ on the left side.      Patellar reflexes are 2+ on the right side and 2+ on the left side. Psychiatric:        Speech: Speech normal.        Behavior: Behavior  normal.        Thought Content: Thought content normal.     Assessment/Plan: Health Maintenance Due  Topic Date Due  . PNA vac Low Risk Adult (1 of 2 - PCV13) 08/01/2019   Health Maintenance reviewed - she will get shingrix through pharmacy. Prevnar 20 given today.  1. Preventative health care Encouraged continuing regular exercise, healthy eating.  - Zoster Vaccine Adjuvanted [facility]) injection; Inject 0.5 mLs into the muscle once for 1 dose. Repeat in 2-6 months  Dispense: 0.5 mL; Refill: 0  2. Essential hypertension wel controlled. Continue current meidcation.  - CBC with Differential/Platelet; Future - Comprehensive metabolic panel; Future - Comprehensive metabolic panel - CBC with Differential/Platelet  3. Osteopenia, unspecified location Records requested from gyn today for prior DEXA. She is hesitant to take tx for bone density due to concerns with side effects of medications.   4. Vitamin D deficiency Continue supplement; will check levels with bloodwork.  - VITAMIN D 25 Hydroxy (Vit-D Deficiency, Fractures); Future - VITAMIN D 25 Hydroxy (Vit-D Deficiency, Fractures)  5. Anxiety Prefers just prn medication rather than daily. Stress level has improved since retiring. Discussed new medication(s) today with patient. Discussed potential side effects and patient verbalized understanding.  - clonazePAM (KLONOPIN) 0.5 MG tablet; Take 1 tablet (0.5 mg total) by mouth daily as needed for anxiety.  Dispense: 20 tablet; Refill: 0  6. Encounter for screening mammogram for malignant neoplasm of breast - MM DIGITAL SCREENING BILATERAL; Future  7. Former smoker - Ambulatory Referral for [place]  8. Shortness of breath ekg sinus rhythm, stable from previous 2015 without acute change. She prefers to wait on any  lung imaging, but will complete lung ca screening and we can determine follow up pending this.  - EKG 12-Lead  9. Lipid screening - Lipid panel; Future -  Lipid panel  10. Need for prophylactic vaccination against Streptococcus pneumoniae (pneumococcus) - Pneumococcal conjugate vaccine 20-valent (Prevnar 20)  11. Right shoulder pain Trial meloxicam x 2 weeks, rest shoulder, but continue with gentle ROM and stretching. Let me know if not better with this.  Return for pending labs, imaging results.  Micheline Rough, MD

## 2020-09-07 NOTE — Addendum Note (Signed)
Addended by: Agnes Lawrence on: 09/07/2020 08:53 AM   Modules accepted: Orders

## 2020-09-08 ENCOUNTER — Ambulatory Visit (INDEPENDENT_AMBULATORY_CARE_PROVIDER_SITE_OTHER): Payer: Medicare HMO

## 2020-09-08 ENCOUNTER — Other Ambulatory Visit: Payer: Self-pay

## 2020-09-08 ENCOUNTER — Other Ambulatory Visit: Payer: Medicare HMO

## 2020-09-08 DIAGNOSIS — R0602 Shortness of breath: Secondary | ICD-10-CM | POA: Diagnosis not present

## 2020-09-08 DIAGNOSIS — J449 Chronic obstructive pulmonary disease, unspecified: Secondary | ICD-10-CM | POA: Diagnosis not present

## 2020-09-11 ENCOUNTER — Other Ambulatory Visit: Payer: Self-pay | Admitting: *Deleted

## 2020-09-11 DIAGNOSIS — Z87891 Personal history of nicotine dependence: Secondary | ICD-10-CM

## 2020-10-04 ENCOUNTER — Other Ambulatory Visit: Payer: Self-pay

## 2020-10-04 ENCOUNTER — Encounter: Payer: Self-pay | Admitting: Acute Care

## 2020-10-04 ENCOUNTER — Ambulatory Visit (INDEPENDENT_AMBULATORY_CARE_PROVIDER_SITE_OTHER): Payer: Medicare HMO | Admitting: Acute Care

## 2020-10-04 ENCOUNTER — Ambulatory Visit
Admission: RE | Admit: 2020-10-04 | Discharge: 2020-10-04 | Disposition: A | Discharge status: Home / Self Care | Payer: Medicare HMO | Source: Ambulatory Visit | Attending: Acute Care | Admitting: Acute Care

## 2020-10-04 VITALS — BP 120/70 | HR 93 | Temp 97.8°F | Ht 60.0 in | Wt 128.0 lb

## 2020-10-04 DIAGNOSIS — Z87891 Personal history of nicotine dependence: Secondary | ICD-10-CM | POA: Diagnosis not present

## 2020-10-04 DIAGNOSIS — Z122 Encounter for screening for malignant neoplasm of respiratory organs: Secondary | ICD-10-CM

## 2020-10-04 DIAGNOSIS — J984 Other disorders of lung: Secondary | ICD-10-CM | POA: Diagnosis not present

## 2020-10-04 DIAGNOSIS — I2510 Atherosclerotic heart disease of native coronary artery without angina pectoris: Secondary | ICD-10-CM | POA: Diagnosis not present

## 2020-10-04 DIAGNOSIS — J432 Centrilobular emphysema: Secondary | ICD-10-CM | POA: Diagnosis not present

## 2020-10-04 NOTE — Patient Instructions (Signed)
Thank you for participating in the [facility]. It was our pleasure to meet you today. We will call you with the results of your scan within the next few days. Your scan will be assigned a Lung RADS category score by the physicians reading the scans.  This Lung RADS score determines follow up scanning.  See below for description of categories, and follow up screening recommendations. We will be in touch to schedule your follow up screening annually or based on recommendations of our providers. We will fax a copy of your scan results to your Primary Care Physician, or the physician who referred you to the program, to ensure they have the results. Please call the office if you have any questions or concerns regarding your scanning experience or results.  Our office number is [phone]. Please speak with Denise Phelps, RN. She is our Lung Cancer Screening RN. If she is unavailable when you call, please have the office staff send her a message. She will return your call at her earliest convenience. Remember, if your scan is normal, we will scan you annually as long as you continue to meet the criteria for the program. (Age 55-77, Current smoker or smoker who has quit within the last 15 years). If you are a smoker, remember, quitting is the single most powerful action that you can take to decrease your risk of lung cancer and other pulmonary, breathing related problems. We know quitting is hard, and we are here to help.  Please let us know if there is anything we can do to help you meet your goal of quitting. If you are a former smoker, congratulations. We are proud of you! Remain smoke free! Remember you can refer friends or family members through the number above.  We will screen them to make sure they meet criteria for the program. Thank you for helping us take better care of you by participating in Lung Screening.  Lung RADS Categories:  Lung RADS 1: no nodules  or definitely non-concerning nodules.  Recommendation is for a repeat annual scan in 12 months.  Lung RADS 2:  nodules that are non-concerning in appearance and behavior with a very low likelihood of becoming an active cancer. Recommendation is for a repeat annual scan in 12 months.  Lung RADS 3: nodules that are probably non-concerning , includes nodules with a low likelihood of becoming an active cancer.  Recommendation is for a 6-month repeat screening scan. Often noted after an upper respiratory illness. We will be in touch to make sure you have no questions, and to schedule your 6-month scan.  Lung RADS 4 A: nodules with concerning findings, recommendation is most often for a follow up scan in 3 months or additional testing based on our provider's assessment of the scan. We will be in touch to make sure you have no questions and to schedule the recommended 3 month follow up scan.  Lung RADS 4 B:  indicates findings that are concerning. We will be in touch with you to schedule additional diagnostic testing based on our provider's  assessment of the scan.   

## 2020-10-04 NOTE — Progress Notes (Signed)
Shared Decision Making Visit Lung Cancer Screening Program [phone])   Eligibility:  Age 65 y.o.  Pack Years Smoking History Calculation 34 pack year smoking history (# packs/per year x # years smoked)  Recent History of coughing up blood  no  Unexplained weight loss? no ( >Than 15 pounds within the last 6 months )  Prior History Lung / other cancer no (Diagnosis within the last 5 years already requiring surveillance chest CT Scans).  Smoking Status Former Smoker  Former Smokers: Years since quit:  2 years  Quit Date: 08/2018  Visit Components:  Discussion included one or more decision making aids. yes  Discussion included risk/benefits of screening. yes  Discussion included potential follow up diagnostic testing for abnormal scans. yes  Discussion included meaning and risk of over diagnosis. yes  Discussion included meaning and risk of False Positives. yes  Discussion included meaning of total radiation exposure. yes  Counseling Included:  Importance of adherence to annual lung cancer LDCT screening. yes  Impact of comorbidities on ability to participate in the program. yes  Ability and willingness to under diagnostic treatment. yes  Smoking Cessation Counseling:  Current Smokers:   Discussed importance of smoking cessation. yes  Information about tobacco cessation classes and interventions provided to patient. yes  Patient provided with "ticket" for LDCT Scan. yes  Symptomatic Patient. no  Counseling NA  Diagnosis Code: Tobacco Use Z72.0  Asymptomatic Patient yes  Counseling (Intermediate counseling: > three minutes counseling) U5427  Former Smokers:   Discussed the importance of maintaining cigarette abstinence. yes  Diagnosis Code: Personal History of Nicotine Dependence. C62.376  Information about tobacco cessation classes and interventions provided to patient. Yes  Patient provided with "ticket" for LDCT Scan. yes  Written Order for Lung  Cancer Screening with LDCT placed in Epic. Yes (CT Chest Lung Cancer Screening Low Dose W/O CM) [id number] Z12.2-Screening of respiratory organs Z87.891-Personal history of nicotine dependence  BP 120/70 (BP Location: Left Arm, Patient Position: Sitting, Cuff Size: Normal)   Pulse 93   Temp 97.8 F (36.6 C) (Temporal)   Ht 5' (1.524 m)   Wt 128 lb (58.1 kg)   SpO2 93%   BMI 25.00 kg/m   I spent 25 minutes of face to face time with Ms. Fernholz discussing the risks and benefits of lung cancer screening. We viewed a power point together that explained in detail the above noted topics. We took the time to pause the power point at intervals to allow for questions to be asked and answered to ensure understanding. We discussed that she had taken the single most powerful action possible to decrease her risk of developing lung cancer when she quit smoking. I counseled her to remain smoke free, and to contact me if she ever had the desire to smoke again so that I can provide resources and tools to help support the effort to remain smoke free. We discussed the time and location of the scan, and that either  Doroteo Glassman RN or I will call with the results within  24-48 hours of receiving them. She has my card and contact information in the event she needs to speak with me, in addition to a copy of the power point we reviewed as a resource. She verbalized understanding of all of the above and had no further questions upon leaving the office.     I explained to the patient that there has been a high incidence of coronary artery disease noted on these exams. I  explained that this is a non-gated exam therefore degree or severity cannot be determined. This patient is not on statin therapy. I have asked the patient to follow-up with their PCP regarding any incidental finding of coronary artery disease and management with diet or medication as they feel is clinically indicated. The patient verbalized understanding of  the above and had no further questions.     Magdalen Spatz, NP 10/04/2020

## 2020-10-09 ENCOUNTER — Other Ambulatory Visit (INDEPENDENT_AMBULATORY_CARE_PROVIDER_SITE_OTHER): Payer: Medicare HMO

## 2020-10-09 ENCOUNTER — Other Ambulatory Visit: Payer: Self-pay

## 2020-10-09 ENCOUNTER — Other Ambulatory Visit: Payer: Self-pay | Admitting: Family Medicine

## 2020-10-09 DIAGNOSIS — D649 Anemia, unspecified: Secondary | ICD-10-CM | POA: Diagnosis not present

## 2020-10-09 LAB — CBC WITH DIFFERENTIAL/PLATELET
Basophils Absolute: 0 10*3/uL (ref 0.0–0.1)
Basophils Relative: 0.9 % (ref 0.0–3.0)
Eosinophils Absolute: 0.3 10*3/uL (ref 0.0–0.7)
Eosinophils Relative: 5.7 % — ABNORMAL HIGH (ref 0.0–5.0)
HCT: 43.8 % (ref 36.0–46.0)
Hemoglobin: 15.2 g/dL — ABNORMAL HIGH (ref 12.0–15.0)
Lymphocytes Relative: 31.1 % (ref 12.0–46.0)
Lymphs Abs: 1.5 10*3/uL (ref 0.7–4.0)
MCHC: 34.6 g/dL (ref 30.0–36.0)
MCV: 92.3 fl (ref 78.0–100.0)
Monocytes Absolute: 0.5 10*3/uL (ref 0.1–1.0)
Monocytes Relative: 11.2 % (ref 3.0–12.0)
Neutro Abs: 2.4 10*3/uL (ref 1.4–7.7)
Neutrophils Relative %: 51.1 % (ref 43.0–77.0)
Platelets: 215 10*3/uL (ref 150.0–400.0)
RBC: 4.75 Mil/uL (ref 3.87–5.11)
RDW: 13.2 % (ref 11.5–15.5)
WBC: 4.8 10*3/uL (ref 4.0–10.5)

## 2020-10-10 DIAGNOSIS — C300 Malignant neoplasm of nasal cavity: Secondary | ICD-10-CM | POA: Diagnosis not present

## 2020-10-16 NOTE — Progress Notes (Signed)
Please call patient and let them  know their  low dose Ct was read as a Lung RADS 2: nodules that are benign in appearance and behavior with a very low likelihood of becoming a clinically active cancer due to size or lack of growth. Recommendation per radiology is for a repeat LDCT in 12 months. Please let them  know we will order and schedule their  annual screening scan for 09/2021. Please let them  know there was notation of CAD on their  scan.  Please remind the patient  that this is a non-gated exam therefore degree or severity of disease  cannot be determined. Please have them  follow up with their PCP regarding potential risk factor modification, dietary therapy or pharmacologic therapy if clinically indicated. Pt.  is not  currently on statin therapy. Please place order for annual  screening scan for  09/2021 and fax results to PCP. Thanks so much. 

## 2020-10-18 ENCOUNTER — Telehealth: Payer: Self-pay | Admitting: Family Medicine

## 2020-10-18 NOTE — Telephone Encounter (Signed)
Left message for patient to call back and schedule Medicare Annual Wellness Visit (AWV) either virtually or in office.   AWVI  please schedule at anytime with [facility] Nurse Health Advisor 1 or 2   This should be a 45 minute visit. 

## 2020-10-19 ENCOUNTER — Other Ambulatory Visit: Payer: Self-pay | Admitting: Family Medicine

## 2020-10-19 ENCOUNTER — Other Ambulatory Visit: Payer: Self-pay | Admitting: *Deleted

## 2020-10-19 ENCOUNTER — Encounter: Payer: Self-pay | Admitting: *Deleted

## 2020-10-19 DIAGNOSIS — Z87891 Personal history of nicotine dependence: Secondary | ICD-10-CM

## 2020-10-31 DIAGNOSIS — H35033 Hypertensive retinopathy, bilateral: Secondary | ICD-10-CM | POA: Diagnosis not present

## 2020-10-31 DIAGNOSIS — H353132 Nonexudative age-related macular degeneration, bilateral, intermediate dry stage: Secondary | ICD-10-CM | POA: Diagnosis not present

## 2020-10-31 DIAGNOSIS — H11153 Pinguecula, bilateral: Secondary | ICD-10-CM | POA: Diagnosis not present

## 2020-10-31 DIAGNOSIS — H43811 Vitreous degeneration, right eye: Secondary | ICD-10-CM | POA: Diagnosis not present

## 2020-11-15 ENCOUNTER — Ambulatory Visit (INDEPENDENT_AMBULATORY_CARE_PROVIDER_SITE_OTHER): Payer: Medicare HMO | Admitting: Family Medicine

## 2020-11-15 ENCOUNTER — Other Ambulatory Visit: Payer: Self-pay

## 2020-11-15 ENCOUNTER — Encounter: Payer: Self-pay | Admitting: Family Medicine

## 2020-11-15 VITALS — BP 118/72 | HR 67 | Temp 98.3°F | Wt 128.8 lb

## 2020-11-15 DIAGNOSIS — S30861A Insect bite (nonvenomous) of abdominal wall, initial encounter: Secondary | ICD-10-CM | POA: Diagnosis not present

## 2020-11-15 DIAGNOSIS — W57XXXA Bitten or stung by nonvenomous insect and other nonvenomous arthropods, initial encounter: Secondary | ICD-10-CM | POA: Diagnosis not present

## 2020-11-15 MED ORDER — DOXYCYCLINE HYCLATE 100 MG PO CAPS: 100.0000 mg | ORAL_CAPSULE | Freq: Two times a day (BID) | ORAL | 0 refills | Status: AC

## 2020-11-15 NOTE — Progress Notes (Signed)
   Subjective:    Patient ID: Amanda Moss, female    DOB: 03-03-1956, 65 y.o.   MRN: [id number]  HPI Here for a tick that is attached to her abdomen. She discovered this last night. Other than some local itching she feels fine.    Review of Systems  Constitutional: Negative.   Respiratory: Negative.   Cardiovascular: Negative.   Musculoskeletal: Negative.   Neurological: Negative.        Objective:   Physical Exam Constitutional:      Appearance: Normal appearance.  Cardiovascular:     Rate and Rhythm: Normal rate and regular rhythm.     Pulses: Normal pulses.     Heart sounds: Normal heart sounds.  Pulmonary:     Effort: Pulmonary effort is normal.     Breath sounds: Normal breath sounds.  Skin:    Comments: There is an engorged tick embedded on the left lower abdomen. The skin is clear with no rashes   Neurological:     Mental Status: She is alert.           Assessment & Plan:  Tick bite. The tick was removed with forceps and destroyed. We will cover her with 10 days of Doxycycline.  Amanda Penna, MD

## 2020-12-21 ENCOUNTER — Ambulatory Visit (INDEPENDENT_AMBULATORY_CARE_PROVIDER_SITE_OTHER): Payer: Medicare HMO

## 2020-12-21 ENCOUNTER — Other Ambulatory Visit: Payer: Self-pay

## 2020-12-21 VITALS — BP 138/78 | HR 86 | Temp 98.0°F | Ht 60.0 in | Wt 128.0 lb

## 2020-12-21 DIAGNOSIS — Z0000 Encounter for general adult medical examination without abnormal findings: Secondary | ICD-10-CM

## 2020-12-21 NOTE — Progress Notes (Signed)
Subjective:   Amanda Moss is a 65 y.o. female who presents for an Initial Medicare Annual Wellness Visit.  Review of Systems    N/a       Objective:    There were no vitals filed for this visit. There is no height or weight on file to calculate BMI.  No flowsheet data found.  Current Medications (verified) Outpatient Encounter Medications as of 12/21/2020  Medication Sig   amLODipine (NORVASC) 5 MG tablet TAKE 1 AND 1/2 TABLETS BY MOUTH ONCE DAILY   budesonide (PULMICORT) 0.5 MG/2ML nebulizer solution Mix one ampule (1 dose) to 250mL of saline in rinse bottle; irrigate sinuses with 120 mL through each nostril daily. QTY: 30 day supply   Cholecalciferol (VITAMIN D PO) Take by mouth.   clonazePAM (KLONOPIN) 0.5 MG tablet Take 1 tablet (0.5 mg total) by mouth daily as needed for anxiety.   meloxicam (MOBIC) 7.5 MG tablet Take 1 tablet (7.5 mg total) by mouth daily.   Multiple Vitamins-Minerals (PRESERVISION AREDS PO) Take 1 tablet by mouth daily.    No facility-administered encounter medications on file as of 12/21/2020.    Allergies (verified) Codeine sulfate   History: Past Medical History:  Diagnosis Date   Bleeding 2003   hemorrhoidal bleeding   Hypertension    Squamous cell cancer of skin of nose    per patient treated by Dr Trixie Deis PAIN, RIGHT 06/20/2010   Qualifier: Diagnosis of  By: Elizebeth Koller MD, Mora Appl     Tobacco use    Tobacco use disorder 09/21/2014   Vitamin D deficiency    Past Surgical History:  Procedure Laterality Date   CESAREAN SECTION     COSMETIC SURGERY  2001   eyes   NASAL RECONSTRUCTION     RHINOPLASTY     SQUAMOUS CELL CARCINOMA EXCISION     on nose; require reconstruction   Family History  Problem Relation Age of Onset   Dementia Mother    Cancer Father 45       lung   Hypertension Father    Pneumonia Father 98   Prostate cancer Brother    Diabetes Neg Hx    Social History   Socioeconomic History   Marital status: Divorced     Spouse name: Not on file   Number of children: 1   Years of education: Not on file   Highest education level: Not on file  Occupational History   Occupation: Psychiatrist: [employer]  Tobacco Use   Smoking status: Former    Packs/day: 0.50    Years: 40.00    Pack years: 20.00    Types: Cigarettes    Quit date: 08/2018    Years since quitting: 2.3   Smokeless tobacco: Never  Substance and Sexual Activity   Alcohol use: Yes    Alcohol/week: 0.0 standard drinks    Comment: 2-3 drinks a few days per week   Drug use: No   Sexual activity: Not on file  Other Topics Concern   Not on file  Social History Narrative   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  Patient Care Team: Caren Macadam, MD as PCP - General (Family Medicine) Molli Posey, MD as Consulting Physician (Obstetrics and Gynecology)  Indicate any recent Medical Services you may have received from other than [facility] providers in the past year (date may be approximate).     Assessment:   This is a routine wellness examination for [employer].  Hearing/Vision screen No results found.  Dietary issues and exercise activities discussed:     Goals Addressed   None    Depression Screen PHQ 2/9 Scores 02/09/2020 05/31/2019 02/19/2019 04/30/2018 12/25/2017  PHQ - 2 Score 2 0 0 2 1  PHQ- 9 Score 8 - - 7 -    Fall Risk Fall Risk   09/06/2020  Falls in the past year? 0  Number falls in past yr: 0    FALL RISK PREVENTION PERTAINING TO THE HOME:  Any stairs in or around the home? Yes  If so, are there any without handrails? No  Home free of loose throw rugs in walkways, pet beds, electrical cords, etc? Yes  Adequate lighting in your home to reduce risk of falls? Yes   ASSISTIVE DEVICES UTILIZED TO PREVENT FALLS:  Life alert? No  Use of a cane, walker or w/c? Yes  Grab bars in the bathroom? No  Shower chair or bench in shower? No  Elevated toilet seat or a handicapped toilet? Yes   TIMED UP AND GO:  Was the test performed? Yes .  Length of time to ambulate 10 feet: 7 sec.   Gait steady and fast without use of assistive device  Cognitive Function:        Immunizations Immunization History  Administered Date(s) Administered   Influenza-Unspecified 03/17/2014, 03/18/2015, 04/17/2018   PFIZER(Purple Top)SARS-COV-2 Vaccination 07/31/2019, 08/25/2019, 03/17/2020   PNEUMOCOCCAL CONJUGATE-20 09/06/2020   Tdap 03/02/2014   Zoster, Live 09/21/2014    TDAP status: Up to date  Flu Vaccine status: Up to date  Pneumococcal vaccine status: Up to date  Covid-19 vaccine status: Completed vaccines  Qualifies for Shingles Vaccine? Yes   Zostavax completed No   Shingrix Completed?: No.    Education has been provided regarding the importance of this vaccine. Patient has been advised to call insurance company to determine out of pocket expense if they have not yet received this vaccine. Advised may also receive vaccine at local pharmacy or Health Dept. Verbalized acceptance and understanding.  Screening Tests Health Maintenance  Topic Date Due   Zoster Vaccines- Shingrix (1 of 2) Never done   PNA vac Low Risk Adult (1 of 2 - PCV13) 08/01/2019   COVID-19 Vaccine (4 - Booster for Pfizer series) 06/17/2020   INFLUENZA VACCINE  01/15/2021   COLONOSCOPY (Pts 45-32yrs Insurance coverage will need to be confirmed)   05/25/2021   MAMMOGRAM  09/06/2021   TETANUS/TDAP  03/02/2024   DEXA SCAN  Completed   Hepatitis C Screening  Completed   HPV VACCINES  Aged Out    Health Maintenance  Health Maintenance Due  Topic Date Due   Zoster Vaccines- Shingrix (1 of 2) Never  done   PNA vac Low Risk Adult (1 of 2 - PCV13) 08/01/2019   COVID-19 Vaccine (4 - Booster for Pfizer series) 06/17/2020    Colorectal cancer screening: Type of screening: Colonoscopy. Completed 05/25/2018. Repeat every 3 years  Mammogram status: Completed scheduled 02/26/2021. Repeat every year  Bone Density status: Completed 06/17/2017. Results reflect: Bone density results: OSTEOPENIA. Repeat every 5 years.  Lung Cancer Screening: (Low Dose CT Chest recommended if Age 78-80 years, 30 pack-year currently smoking OR have quit w/in 15years.) does not qualify.   Lung Cancer Screening Referral: n/a  Additional Screening:  Hepatitis C Screening: does not qualify; Completed 07/11/2016  Vision Screening: Recommended annual ophthalmology exams for early detection of glaucoma and other disorders of the eye. Is the patient up to date with their annual eye exam?  Yes  Who is the provider or what is the name of the office in which the patient attends annual eye exams? Dr.Parker If pt is not established with a provider, would they like to be referred to a provider to establish care? No .   Dental Screening: Recommended annual dental exams for proper oral hygiene  Community Resource Referral / Chronic Care Management: CRR required this visit?  No   CCM required this visit?  No      Plan:     I have personally reviewed and noted the following in the patient's chart:   Medical and social history Use of alcohol, tobacco or illicit drugs  Current medications and supplements including opioid prescriptions. Patient is not currently taking opioid prescriptions. Functional ability and status Nutritional status Physical activity Advanced  directives List of other physicians Hospitalizations, surgeries, and ER visits in previous 12 months Vitals Screenings to include cognitive, depression, and falls Referrals and appointments  In addition, I have reviewed and discussed with patient certain preventive protocols, quality metrics, and best practice recommendations. A written personalized care plan for preventive services as well as general preventive health recommendations were provided to patient.     Randel Pigg, LPN   08/19/1936   Nurse Notes: none

## 2020-12-21 NOTE — Patient Instructions (Signed)
Amanda Moss , Thank you for taking time to come for your Medicare Wellness Visit. I appreciate your ongoing commitment to your health goals. Please review the following plan we discussed and let me know if I can assist you in the future.   Screening recommendations/referrals: Colonoscopy: 05/25/2018  due 2022 Mammogram: 02/26/2021 scheduled  Bone Density: 06/17/2017 Recommended yearly ophthalmology/optometry visit for glaucoma screening and checkup Recommended yearly dental visit for hygiene and checkup  Vaccinations: Influenza vaccine: due in fall 2022 Pneumococcal vaccine: completed series  Tdap vaccine: 03/02/2014 Shingles vaccine: will obtain local pharmacy   Advanced directives: will provide copies   Conditions/risks identified: none   Next appointment: none    Preventive Care 13 Years and Older, Female Preventive care refers to lifestyle choices and visits with your health care provider that can promote health and wellness. What does preventive care include? A yearly physical exam. This is also called an annual well check. Dental exams once or twice a year. Routine eye exams. Ask your health care provider how often you should have your eyes checked. Personal lifestyle choices, including: Daily care of your teeth and gums. Regular physical activity. Eating a healthy diet. Avoiding tobacco and drug use. Limiting alcohol use. Practicing safe sex. Taking low-dose aspirin every day. Taking vitamin and mineral supplements as recommended by your health care provider. What happens during an annual well check? The services and screenings done by your health care provider during your annual well check will depend on your age, overall health, lifestyle risk factors, and family history of disease. Counseling  Your health care provider may ask you questions about your: Alcohol use. Tobacco use. Drug use. Emotional well-being. Home and relationship well-being. Sexual  activity. Eating habits. History of falls. Memory and ability to understand (cognition). Work and work Statistician. Reproductive health. Screening  You may have the following tests or measurements: Height, weight, and BMI. Blood pressure. Lipid and cholesterol levels. These may be checked every 5 years, or more frequently if you are over 9 years old. Skin check. Lung cancer screening. You may have this screening every year starting at age 76 if you have a 30-pack-year history of smoking and currently smoke or have quit within the past 15 years. Fecal occult blood test (FOBT) of the stool. You may have this test every year starting at age 43. Flexible sigmoidoscopy or colonoscopy. You may have a sigmoidoscopy every 5 years or a colonoscopy every 10 years starting at age 41. Hepatitis C blood test. Hepatitis B blood test. Sexually transmitted disease (STD) testing. Diabetes screening. This is done by checking your blood sugar (glucose) after you have not eaten for a while (fasting). You may have this done every 1-3 years. Bone density scan. This is done to screen for osteoporosis. You may have this done starting at age 56. Mammogram. This may be done every 1-2 years. Talk to your health care provider about how often you should have regular mammograms. Talk with your health care provider about your test results, treatment options, and if necessary, the need for more tests. Vaccines  Your health care provider may recommend certain vaccines, such as: Influenza vaccine. This is recommended every year. Tetanus, diphtheria, and acellular pertussis (Tdap, Td) vaccine. You may need a Td booster every 10 years. Zoster vaccine. You may need this after age 17. Pneumococcal 13-valent conjugate (PCV13) vaccine. One dose is recommended after age 65. Pneumococcal polysaccharide (PPSV23) vaccine. One dose is recommended after age 23. Talk to your health care provider  about which screenings and vaccines  you need and how often you need them. This information is not intended to replace advice given to you by your health care provider. Make sure you discuss any questions you have with your health care provider. Document Released: 06/30/2015 Document Revised: 02/21/2016 Document Reviewed: 04/04/2015 Elsevier Interactive Patient Education  2017 [place] Prevention in the Home Falls can cause injuries. They can happen to people of all ages. There are many things you can do to make your home safe and to help prevent falls. What can I do on the outside of my home? Regularly fix the edges of walkways and driveways and fix any cracks. Remove anything that might make you trip as you walk through a door, such as a raised step or threshold. Trim any bushes or trees on the path to your home. Use bright outdoor lighting. Clear any walking paths of anything that might make someone trip, such as rocks or tools. Regularly check to see if handrails are loose or broken. Make sure that both sides of any steps have handrails. Any raised decks and porches should have guardrails on the edges. Have any leaves, snow, or ice cleared regularly. Use sand or salt on walking paths during winter. Clean up any spills in your garage right away. This includes oil or grease spills. What can I do in the bathroom? Use night lights. Install grab bars by the toilet and in the tub and shower. Do not use towel bars as grab bars. Use non-skid mats or decals in the tub or shower. If you need to sit down in the shower, use a plastic, non-slip stool. Keep the floor dry. Clean up any water that spills on the floor as soon as it happens. Remove soap buildup in the tub or shower regularly. Attach bath mats securely with double-sided non-slip rug tape. Do not have throw rugs and other things on the floor that can make you trip. What can I do in the bedroom? Use night lights. Make sure that you have a light by your bed that  is easy to reach. Do not use any sheets or blankets that are too big for your bed. They should not hang down onto the floor. Have a firm chair that has side arms. You can use this for support while you get dressed. Do not have throw rugs and other things on the floor that can make you trip. What can I do in the kitchen? Clean up any spills right away. Avoid walking on wet floors. Keep items that you use a lot in easy-to-reach places. If you need to reach something above you, use a strong step stool that has a grab bar. Keep electrical cords out of the way. Do not use floor polish or wax that makes floors slippery. If you must use wax, use non-skid floor wax. Do not have throw rugs and other things on the floor that can make you trip. What can I do with my stairs? Do not leave any items on the stairs. Make sure that there are handrails on both sides of the stairs and use them. Fix handrails that are broken or loose. Make sure that handrails are as long as the stairways. Check any carpeting to make sure that it is firmly attached to the stairs. Fix any carpet that is loose or worn. Avoid having throw rugs at the top or bottom of the stairs. If you do have throw rugs, attach them to the floor  with carpet tape. Make sure that you have a light switch at the top of the stairs and the bottom of the stairs. If you do not have them, ask someone to add them for you. What else can I do to help prevent falls? Wear shoes that: Do not have high heels. Have rubber bottoms. Are comfortable and fit you well. Are closed at the toe. Do not wear sandals. If you use a stepladder: Make sure that it is fully opened. Do not climb a closed stepladder. Make sure that both sides of the stepladder are locked into place. Ask someone to hold it for you, if possible. Clearly mark and make sure that you can see: Any grab bars or handrails. First and last steps. Where the edge of each step is. Use tools that help you  move around (mobility aids) if they are needed. These include: Canes. Walkers. Scooters. Crutches. Turn on the lights when you go into a dark area. Replace any light bulbs as soon as they burn out. Set up your furniture so you have a clear path. Avoid moving your furniture around. If any of your floors are uneven, fix them. If there are any pets around you, be aware of where they are. Review your medicines with your doctor. Some medicines can make you feel dizzy. This can increase your chance of falling. Ask your doctor what other things that you can do to help prevent falls. This information is not intended to replace advice given to you by your health care provider. Make sure you discuss any questions you have with your health care provider. Document Released: 03/30/2009 Document Revised: 11/09/2015 Document Reviewed: 07/08/2014 Elsevier Interactive Patient Education  2017 [employer].

## 2021-01-09 DIAGNOSIS — J3489 Other specified disorders of nose and nasal sinuses: Secondary | ICD-10-CM | POA: Diagnosis not present

## 2021-01-09 DIAGNOSIS — C300 Malignant neoplasm of nasal cavity: Secondary | ICD-10-CM | POA: Diagnosis not present

## 2021-01-31 ENCOUNTER — Other Ambulatory Visit: Payer: Self-pay | Admitting: Family Medicine

## 2021-02-26 ENCOUNTER — Ambulatory Visit
Admission: RE | Admit: 2021-02-26 | Discharge: 2021-02-26 | Disposition: A | Discharge status: Home / Self Care | Payer: Medicare HMO | Source: Ambulatory Visit | Attending: Family Medicine | Admitting: Family Medicine

## 2021-02-26 ENCOUNTER — Other Ambulatory Visit: Payer: Self-pay

## 2021-02-26 DIAGNOSIS — Z1231 Encounter for screening mammogram for malignant neoplasm of breast: Secondary | ICD-10-CM | POA: Diagnosis not present

## 2021-03-03 DIAGNOSIS — H10522 Angular blepharoconjunctivitis, left eye: Secondary | ICD-10-CM | POA: Diagnosis not present

## 2021-04-04 ENCOUNTER — Telehealth: Payer: Self-pay | Admitting: *Deleted

## 2021-04-04 NOTE — Telephone Encounter (Signed)
Fax received from the Access Nurse stating: -caller states her left eye is red and has a light yellow crust, itchy.  Had conjuctivitis in the middle of September in the same eye.  No fever or other symptoms. -Disposition: advised to call PCP in 24 hours.  I called the patient and offered a virtual visit with Dr Maudie Mercury tomorrow as there are no openings available today.  Patient declined as she has to be in court all day tomorrow, was advised there are openings available from 5-6:20pm.  Patient declined and stated she will call back tomorrow if needed.

## 2021-04-17 DIAGNOSIS — C300 Malignant neoplasm of nasal cavity: Secondary | ICD-10-CM | POA: Diagnosis not present

## 2021-05-01 ENCOUNTER — Other Ambulatory Visit: Payer: Self-pay | Admitting: Family Medicine

## 2021-07-03 ENCOUNTER — Other Ambulatory Visit: Payer: Self-pay | Admitting: Family Medicine

## 2021-07-17 DIAGNOSIS — J3489 Other specified disorders of nose and nasal sinuses: Secondary | ICD-10-CM | POA: Diagnosis not present

## 2021-07-17 DIAGNOSIS — C300 Malignant neoplasm of nasal cavity: Secondary | ICD-10-CM | POA: Diagnosis not present

## 2021-09-14 ENCOUNTER — Ambulatory Visit (INDEPENDENT_AMBULATORY_CARE_PROVIDER_SITE_OTHER): Payer: Medicare HMO | Admitting: Family Medicine

## 2021-09-14 ENCOUNTER — Encounter: Payer: Self-pay | Admitting: Family Medicine

## 2021-09-14 ENCOUNTER — Other Ambulatory Visit (HOSPITAL_COMMUNITY)
Admission: RE | Admit: 2021-09-14 | Discharge: 2021-09-14 | Disposition: A | Discharge status: Home / Self Care | Payer: Medicare HMO | Source: Ambulatory Visit | Attending: Family Medicine | Admitting: Family Medicine

## 2021-09-14 VITALS — BP 122/82 | HR 84 | Temp 97.8°F | Ht 60.5 in | Wt 126.0 lb

## 2021-09-14 DIAGNOSIS — Z124 Encounter for screening for malignant neoplasm of cervix: Secondary | ICD-10-CM

## 2021-09-14 DIAGNOSIS — I10 Essential (primary) hypertension: Secondary | ICD-10-CM | POA: Diagnosis not present

## 2021-09-14 DIAGNOSIS — M8580 Other specified disorders of bone density and structure, unspecified site: Secondary | ICD-10-CM | POA: Diagnosis not present

## 2021-09-14 DIAGNOSIS — Z1151 Encounter for screening for human papillomavirus (HPV): Secondary | ICD-10-CM | POA: Insufficient documentation

## 2021-09-14 DIAGNOSIS — Z13220 Encounter for screening for lipoid disorders: Secondary | ICD-10-CM

## 2021-09-14 DIAGNOSIS — Z0000 Encounter for general adult medical examination without abnormal findings: Secondary | ICD-10-CM | POA: Diagnosis not present

## 2021-09-14 DIAGNOSIS — R69 Illness, unspecified: Secondary | ICD-10-CM | POA: Diagnosis not present

## 2021-09-14 NOTE — Progress Notes (Signed)
Amanda Moss ?DOB: Feb 26, 1956 ?Encounter date: 09/14/2021 ? ?This is a 66 y.o. female who presents for complete physical  ? ?History of present illness/Additional concerns: ? ?Has a mole on her bottom. Has a larger seborrheic on back she would like frozen.  ? ?Not exercising regularly. Does better between working (s/p retirement but working on and off; more activity when she is not working) ? ?Follows with GYN for bone density treatment and management. She went in last year, but didn't get pap because every 2 years for her. Did have cervical cone in past - like in 1990's but has been normal since then.  ? ?Last mammogram 02/26/2021 was normal. ? ?Had lung cancer screening. Due in April for recheck. Getting a lot of phlegm in throat, allergies.  ? ?Hypertension: doesn't check at home. Still taking amlodipine 7.'5mg'$  daily.  ? ?History of squamous cell cancer of the nose: Follows regularly with dermatology. ? ?Last colonoscopy 05/2018.  Repeat due 05/2021.  Last colonoscopy through [facility] endoscopy. She says she called and they told her 5 years.  ? ?Past Medical History:  ?Diagnosis Date  ? Bleeding 2003  ? hemorrhoidal bleeding  ? Hypertension   ? Squamous cell cancer of skin of nose   ? per patient treated by Dr Lucia Gaskins  ? THUMB PAIN, RIGHT 06/20/2010  ? Qualifier: Diagnosis of  By: Elizebeth Koller MD, Mora Appl    ? Tobacco use   ? Tobacco use disorder 09/21/2014  ? Vitamin D deficiency   ? ?Past Surgical History:  ?Procedure Laterality Date  ? CESAREAN SECTION    ? COSMETIC SURGERY  2001  ? eyes  ? NASAL RECONSTRUCTION    ? RHINOPLASTY    ? SQUAMOUS CELL CARCINOMA EXCISION    ? on nose; require reconstruction  ? ?Allergies  ?Allergen Reactions  ? Codeine Sulfate   ?  REACTION: nausea  ? ?Current Meds  ?Medication Sig  ? amLODipine (NORVASC) 5 MG tablet Take 1 and 1/2 tablets by mouth once daily  ? budesonide (PULMICORT) 0.5 MG/2ML nebulizer solution Mix one ampule (1 dose) to 291m of saline in rinse bottle; irrigate sinuses with 120  mL through each nostril daily. QTY: 30 day supply  ? Cholecalciferol (VITAMIN D PO) Take by mouth.  ? clonazePAM (KLONOPIN) 0.5 MG tablet Take 1 tablet (0.5 mg total) by mouth daily as needed for anxiety.  ? Multiple Vitamins-Minerals (PRESERVISION AREDS PO) Take 1 tablet by mouth daily.   ? ?Social History  ? ?Tobacco Use  ? Smoking status: Former  ?  Packs/day: 0.50  ?  Years: 40.00  ?  Pack years: 20.00  ?  Types: Cigarettes  ?  Quit date: 08/2018  ?  Years since quitting: 3.0  ? Smokeless tobacco: Never  ?Substance Use Topics  ? Alcohol use: Yes  ?  Alcohol/week: 0.0 standard drinks  ?  Comment: 2-3 drinks a few days per week  ? ?Family History  ?Problem Relation Age of Onset  ? Dementia Mother   ? Cancer Father 67 ?     lung  ? Hypertension Father   ? Pneumonia Father 768 ? Prostate cancer Brother   ? Diabetes Neg Hx   ? ? ? ?Review of Systems  ?Constitutional:  Negative for activity change, appetite change, chills, fatigue, fever and unexpected weight change.  ?HENT:  Negative for congestion, ear pain, hearing loss, sinus pressure, sinus pain, sore throat and trouble swallowing.   ?Eyes:  Negative for pain and  visual disturbance.  ?Respiratory:  Negative for cough, chest tightness, shortness of breath and wheezing.   ?Cardiovascular:  Negative for chest pain, palpitations and leg swelling.  ?Gastrointestinal:  Negative for abdominal pain, blood in stool, constipation, diarrhea, nausea and vomiting.  ?Genitourinary:  Negative for difficulty urinating and menstrual problem.  ?Musculoskeletal:  Negative for arthralgias and back pain.  ?Skin:  Negative for rash.  ?Neurological:  Negative for dizziness, weakness, numbness and headaches.  ?Hematological:  Negative for adenopathy. Does not bruise/bleed easily.  ?Psychiatric/Behavioral:  Negative for sleep disturbance and suicidal ideas. The patient is not nervous/anxious.   ? ?CBC:  ?Lab Results  ?Component Value Date  ? WBC 4.8 10/09/2020  ? HGB 15.2 (H) 10/09/2020   ? HCT 43.8 10/09/2020  ? MCHC 34.6 10/09/2020  ? RDW 13.2 10/09/2020  ? PLT 215.0 10/09/2020  ? ?CMP: ?Lab Results  ?Component Value Date  ? NA 140 09/06/2020  ? K 4.8 09/06/2020  ? CL 102 09/06/2020  ? CO2 28 09/06/2020  ? GLUCOSE 104 (H) 09/06/2020  ? BUN 22 09/06/2020  ? CREATININE 0.70 09/06/2020  ? CALCIUM 10.2 09/06/2020  ? PROT 7.4 09/06/2020  ? BILITOT 0.8 09/06/2020  ? ALKPHOS 108 09/06/2020  ? ALT 19 09/06/2020  ? AST 16 09/06/2020  ? ?LIPID: ?Lab Results  ?Component Value Date  ? CHOL 198 09/06/2020  ? TRIG 122.0 09/06/2020  ? HDL 60.60 09/06/2020  ? LDLCALC 113 (H) 09/06/2020  ? ? ?Objective: ? ?BP 122/82 (BP Location: Left Arm, Patient Position: Sitting, Cuff Size: Normal)   Pulse 84   Temp 97.8 ?F (36.6 ?C) (Oral)   Ht 5' 0.5" (1.537 m)   Wt 126 lb (57.2 kg)   SpO2 100%   BMI 24.20 kg/m?   Weight: 126 lb (57.2 kg)  ? ?BP Readings from Last 3 Encounters:  ?09/14/21 122/82  ?12/21/20 138/78  ?11/15/20 118/72  ? ?Wt Readings from Last 3 Encounters:  ?09/14/21 126 lb (57.2 kg)  ?12/21/20 128 lb (58.1 kg)  ?11/15/20 128 lb 12.8 oz (58.4 kg)  ? ? ?Physical Exam ?Constitutional:   ?   General: She is not in acute distress. ?   Appearance: She is well-developed.  ?HENT:  ?   Head: Normocephalic and atraumatic.  ?   Right Ear: External ear normal.  ?   Left Ear: External ear normal.  ?   Mouth/Throat:  ?   Pharynx: No oropharyngeal exudate.  ?Eyes:  ?   Conjunctiva/sclera: Conjunctivae normal.  ?   Pupils: Pupils are equal, round, and reactive to light.  ?Neck:  ?   Thyroid: No thyromegaly.  ?Cardiovascular:  ?   Rate and Rhythm: Normal rate and regular rhythm.  ?   Heart sounds: Normal heart sounds. No murmur heard. ?  No friction rub. No gallop.  ?Pulmonary:  ?   Effort: Pulmonary effort is normal.  ?   Breath sounds: Normal breath sounds.  ?Abdominal:  ?   General: Bowel sounds are normal. There is no distension.  ?   Palpations: Abdomen is soft. There is no mass.  ?   Tenderness: There is no  abdominal tenderness. There is no guarding.  ?   Hernia: No hernia is present.  ?Genitourinary: ?   General: Normal vulva.  ?   Exam position: Supine.  ?   Vagina: Normal.  ?   Cervix: Normal.  ?   Uterus: Normal.   ?   Adnexa: Right adnexa normal and left adnexa  normal.  ?   Rectum: Normal. Guaiac result negative.  ?Musculoskeletal:     ?   General: No tenderness or deformity. Normal range of motion.  ?   Cervical back: Normal range of motion and neck supple.  ?Lymphadenopathy:  ?   Cervical: No cervical adenopathy.  ?Skin: ?   General: Skin is warm and dry.  ?   Findings: No rash.  ?Neurological:  ?   Mental Status: She is alert and oriented to person, place, and time.  ?   Deep Tendon Reflexes: Reflexes normal.  ?   Reflex Scores: ?     Tricep reflexes are 2+ on the right side and 2+ on the left side. ?     Bicep reflexes are 2+ on the right side and 2+ on the left side. ?     Brachioradialis reflexes are 2+ on the right side and 2+ on the left side. ?     Patellar reflexes are 2+ on the right side and 2+ on the left side. ?Psychiatric:     ?   Speech: Speech normal.     ?   Behavior: Behavior normal.     ?   Thought Content: Thought content normal.  ? ? ?Assessment/Plan: ?There are no preventive care reminders to display for this patient. ? ?Health Maintenance reviewed. ? ? ?1. Preventative health care ?Continue to work on getting daily exercise.  Otherwise up-to-date with preventative health care. ? ?2. Essential hypertension ?Blood pressure well controlled.  Continue with amlodipine 7.5 mg daily ?- CBC with Differential/Platelet; Future ?- Comprehensive metabolic panel; Future ? ?3. Osteopenia, unspecified location ?Weight bearing exercise, vitamin D. I did not get prior dexa report. She is hesitant for treatment; worries about teeth issues. Discussed low risk of osteonecrosis jaw as side effect;discussed high morbidity/mortality with hip fracture. Will try to get dexa report so that we know when due for  repeat. Continue discussion pending change. ? ?4. Lipid screening ?- Lipid panel; Future ? ?5. Cervical cancer screening ?- PAP [[place]] ? ? ?Return in about 6 months (around 03/16/2022) for Chronic condition visit

## 2021-09-14 NOTE — Patient Instructions (Addendum)
If you don't hear from pulmonology by end of April for CT screening; call: [phone] ? ?*when you schedule next mammogram; ask for bone density as well.  ?

## 2021-09-19 ENCOUNTER — Other Ambulatory Visit: Payer: Medicare HMO

## 2021-09-19 ENCOUNTER — Other Ambulatory Visit (INDEPENDENT_AMBULATORY_CARE_PROVIDER_SITE_OTHER): Payer: Medicare HMO

## 2021-09-19 DIAGNOSIS — Z13220 Encounter for screening for lipoid disorders: Secondary | ICD-10-CM

## 2021-09-19 DIAGNOSIS — I10 Essential (primary) hypertension: Secondary | ICD-10-CM | POA: Diagnosis not present

## 2021-09-19 LAB — CBC WITH DIFFERENTIAL/PLATELET
Basophils Absolute: 0 10*3/uL (ref 0.0–0.1)
Basophils Relative: 0.8 % (ref 0.0–3.0)
Eosinophils Absolute: 0.2 10*3/uL (ref 0.0–0.7)
Eosinophils Relative: 2.9 % (ref 0.0–5.0)
HCT: 43.1 % (ref 36.0–46.0)
Hemoglobin: 14.6 g/dL (ref 12.0–15.0)
Lymphocytes Relative: 35.3 % (ref 12.0–46.0)
Lymphs Abs: 2.1 10*3/uL (ref 0.7–4.0)
MCHC: 33.9 g/dL (ref 30.0–36.0)
MCV: 94.5 fl (ref 78.0–100.0)
Monocytes Absolute: 0.5 10*3/uL (ref 0.1–1.0)
Monocytes Relative: 9.1 % (ref 3.0–12.0)
Neutro Abs: 3 10*3/uL (ref 1.4–7.7)
Neutrophils Relative %: 51.9 % (ref 43.0–77.0)
Platelets: 216 10*3/uL (ref 150.0–400.0)
RBC: 4.56 Mil/uL (ref 3.87–5.11)
RDW: 13.3 % (ref 11.5–15.5)
WBC: 5.8 10*3/uL (ref 4.0–10.5)

## 2021-09-19 LAB — COMPREHENSIVE METABOLIC PANEL
ALT: 19 U/L (ref 0–35)
AST: 19 U/L (ref 0–37)
Albumin: 4.4 g/dL (ref 3.5–5.2)
Alkaline Phosphatase: 101 U/L (ref 39–117)
BUN: 21 mg/dL (ref 6–23)
CO2: 27 mEq/L (ref 19–32)
Calcium: 9.7 mg/dL (ref 8.4–10.5)
Chloride: 102 mEq/L (ref 96–112)
Creatinine, Ser: 0.6 mg/dL (ref 0.40–1.20)
GFR: 93.06 mL/min (ref >60.00)
Glucose, Bld: 88 mg/dL (ref 70–99)
Potassium: 4.4 mEq/L (ref 3.5–5.1)
Sodium: 137 mEq/L (ref 135–145)
Total Bilirubin: 0.4 mg/dL (ref 0.2–1.2)
Total Protein: 7 g/dL (ref 6.0–8.3)

## 2021-09-19 LAB — CYTOLOGY - PAP
Comment: NEGATIVE
Diagnosis: NEGATIVE
Diagnosis: REACTIVE
High risk HPV: NEGATIVE

## 2021-09-19 LAB — LIPID PANEL
Cholesterol: 170 mg/dL (ref 0–200)
HDL: 57.5 mg/dL (ref >39.00)
LDL Cholesterol: 95 mg/dL (ref 0–99)
NonHDL: 112.35
Total CHOL/HDL Ratio: 3
Triglycerides: 85 mg/dL (ref 0.0–149.0)
VLDL: 17 mg/dL (ref 0.0–40.0)

## 2021-11-17 ENCOUNTER — Telehealth: Payer: Self-pay | Admitting: *Deleted

## 2021-11-17 NOTE — Telephone Encounter (Signed)
[id number] to schedule Lung cancer screening CT.

## 2021-11-20 DIAGNOSIS — L570 Actinic keratosis: Secondary | ICD-10-CM | POA: Diagnosis not present

## 2021-11-23 ENCOUNTER — Other Ambulatory Visit: Payer: Self-pay

## 2021-11-23 DIAGNOSIS — Z122 Encounter for screening for malignant neoplasm of respiratory organs: Secondary | ICD-10-CM

## 2021-11-23 DIAGNOSIS — Z87891 Personal history of nicotine dependence: Secondary | ICD-10-CM

## 2021-12-20 ENCOUNTER — Ambulatory Visit
Admission: RE | Admit: 2021-12-20 | Discharge: 2021-12-20 | Disposition: A | Discharge status: Home / Self Care | Payer: Medicare HMO | Source: Ambulatory Visit

## 2021-12-20 ENCOUNTER — Other Ambulatory Visit: Payer: Self-pay | Admitting: Acute Care

## 2021-12-20 DIAGNOSIS — Z87891 Personal history of nicotine dependence: Secondary | ICD-10-CM | POA: Diagnosis not present

## 2021-12-20 DIAGNOSIS — Z122 Encounter for screening for malignant neoplasm of respiratory organs: Secondary | ICD-10-CM

## 2021-12-20 DIAGNOSIS — I2510 Atherosclerotic heart disease of native coronary artery without angina pectoris: Secondary | ICD-10-CM | POA: Diagnosis not present

## 2021-12-20 DIAGNOSIS — J9809 Other diseases of bronchus, not elsewhere classified: Secondary | ICD-10-CM | POA: Diagnosis not present

## 2021-12-20 DIAGNOSIS — J432 Centrilobular emphysema: Secondary | ICD-10-CM | POA: Diagnosis not present

## 2021-12-24 ENCOUNTER — Ambulatory Visit (INDEPENDENT_AMBULATORY_CARE_PROVIDER_SITE_OTHER): Payer: Medicare HMO

## 2021-12-24 VITALS — BP 120/62 | HR 74 | Temp 98.0°F | Ht 60.5 in | Wt 126.5 lb

## 2021-12-24 DIAGNOSIS — Z0000 Encounter for general adult medical examination without abnormal findings: Secondary | ICD-10-CM

## 2021-12-24 NOTE — Patient Instructions (Addendum)
[name] , Thank you for taking time to come for your Medicare Wellness Visit. I appreciate your ongoing commitment to your health goals. Please review the following plan we discussed and let me know if I can assist you in the future.   These are the goals we discussed:  Goals       No current goals (pt-stated)        This is a list of the screening recommended for you and due dates:  Health Maintenance  Topic Date Due   COVID-19 Vaccine (4 - Booster for Pfizer series) 01/09/2022*   Flu Shot  01/15/2022   Mammogram  02/26/2022   Colon Cancer Screening  05/26/2023   Tetanus Vaccine  03/02/2024   Pneumonia Vaccine  Completed   DEXA scan (bone density measurement)  Completed   Hepatitis C Screening: USPSTF Recommendation to screen - Ages 56-79 yo.  Completed   Zoster (Shingles) Vaccine  Completed   HPV Vaccine  Aged Out  *Topic was postponed. The date shown is not the original due date.    Advanced directives: Yes  Conditions/risks identified: None  Next appointment: Follow up in one year for your annual wellness visit     Preventive Care 65 Years and Older, Female Preventive care refers to lifestyle choices and visits with your health care provider that can promote health and wellness. What does preventive care include? A yearly physical exam. This is also called an annual well check. Dental exams once or twice a year. Routine eye exams. Ask your health care provider how often you should have your eyes checked. Personal lifestyle choices, including: Daily care of your teeth and gums. Regular physical activity. Eating a healthy diet. Avoiding tobacco and drug use. Limiting alcohol use. Practicing safe sex. Taking low-dose aspirin every day. Taking vitamin and mineral supplements as recommended by your health care provider. What happens during an annual well check? The services and screenings done by your health care provider during your annual well check will depend  on your age, overall health, lifestyle risk factors, and family history of disease. Counseling  Your health care provider may ask you questions about your: Alcohol use. Tobacco use. Drug use. Emotional well-being. Home and relationship well-being. Sexual activity. Eating habits. History of falls. Memory and ability to understand (cognition). Work and work Statistician. Reproductive health. Screening  You may have the following tests or measurements: Height, weight, and BMI. Blood pressure. Lipid and cholesterol levels. These may be checked every 5 years, or more frequently if you are over 64 years old. Skin check. Lung cancer screening. You may have this screening every year starting at age 73 if you have a 30-pack-year history of smoking and currently smoke or have quit within the past 15 years. Fecal occult blood test (FOBT) of the stool. You may have this test every year starting at age 80. Flexible sigmoidoscopy or colonoscopy. You may have a sigmoidoscopy every 5 years or a colonoscopy every 10 years starting at age 34. Hepatitis C blood test. Hepatitis B blood test. Sexually transmitted disease (STD) testing. Diabetes screening. This is done by checking your blood sugar (glucose) after you have not eaten for a while (fasting). You may have this done every 1-3 years. Bone density scan. This is done to screen for osteoporosis. You may have this done starting at age 33. Mammogram. This may be done every 1-2 years. Talk to your health care provider about how often you should have regular mammograms. Talk with your  health care provider about your test results, treatment options, and if necessary, the need for more tests. Vaccines  Your health care provider may recommend certain vaccines, such as: Influenza vaccine. This is recommended every year. Tetanus, diphtheria, and acellular pertussis (Tdap, Td) vaccine. You may need a Td booster every 10 years. Zoster vaccine. You may need  this after age 93. Pneumococcal 13-valent conjugate (PCV13) vaccine. One dose is recommended after age 64. Pneumococcal polysaccharide (PPSV23) vaccine. One dose is recommended after age 24. Talk to your health care provider about which screenings and vaccines you need and how often you need them. This information is not intended to replace advice given to you by your health care provider. Make sure you discuss any questions you have with your health care provider. Document Released: 06/30/2015 Document Revised: 02/21/2016 Document Reviewed: 04/04/2015 Elsevier Interactive Patient Education  2017 [place] Prevention in the Home Falls can cause injuries. They can happen to people of all ages. There are many things you can do to make your home safe and to help prevent falls. What can I do on the outside of my home? Regularly fix the edges of walkways and driveways and fix any cracks. Remove anything that might make you trip as you walk through a door, such as a raised step or threshold. Trim any bushes or trees on the path to your home. Use bright outdoor lighting. Clear any walking paths of anything that might make someone trip, such as rocks or tools. Regularly check to see if handrails are loose or broken. Make sure that both sides of any steps have handrails. Any raised decks and porches should have guardrails on the edges. Have any leaves, snow, or ice cleared regularly. Use sand or salt on walking paths during winter. Clean up any spills in your garage right away. This includes oil or grease spills. What can I do in the bathroom? Use night lights. Install grab bars by the toilet and in the tub and shower. Do not use towel bars as grab bars. Use non-skid mats or decals in the tub or shower. If you need to sit down in the shower, use a plastic, non-slip stool. Keep the floor dry. Clean up any water that spills on the floor as soon as it happens. Remove soap buildup in the tub  or shower regularly. Attach bath mats securely with double-sided non-slip rug tape. Do not have throw rugs and other things on the floor that can make you trip. What can I do in the bedroom? Use night lights. Make sure that you have a light by your bed that is easy to reach. Do not use any sheets or blankets that are too big for your bed. They should not hang down onto the floor. Have a firm chair that has side arms. You can use this for support while you get dressed. Do not have throw rugs and other things on the floor that can make you trip. What can I do in the kitchen? Clean up any spills right away. Avoid walking on wet floors. Keep items that you use a lot in easy-to-reach places. If you need to reach something above you, use a strong step stool that has a grab bar. Keep electrical cords out of the way. Do not use floor polish or wax that makes floors slippery. If you must use wax, use non-skid floor wax. Do not have throw rugs and other things on the floor that can make you trip. What  can I do with my stairs? Do not leave any items on the stairs. Make sure that there are handrails on both sides of the stairs and use them. Fix handrails that are broken or loose. Make sure that handrails are as long as the stairways. Check any carpeting to make sure that it is firmly attached to the stairs. Fix any carpet that is loose or worn. Avoid having throw rugs at the top or bottom of the stairs. If you do have throw rugs, attach them to the floor with carpet tape. Make sure that you have a light switch at the top of the stairs and the bottom of the stairs. If you do not have them, ask someone to add them for you. What else can I do to help prevent falls? Wear shoes that: Do not have high heels. Have rubber bottoms. Are comfortable and fit you well. Are closed at the toe. Do not wear sandals. If you use a stepladder: Make sure that it is fully opened. Do not climb a closed stepladder. Make  sure that both sides of the stepladder are locked into place. Ask someone to hold it for you, if possible. Clearly mark and make sure that you can see: Any grab bars or handrails. First and last steps. Where the edge of each step is. Use tools that help you move around (mobility aids) if they are needed. These include: Canes. Walkers. Scooters. Crutches. Turn on the lights when you go into a dark area. Replace any light bulbs as soon as they burn out. Set up your furniture so you have a clear path. Avoid moving your furniture around. If any of your floors are uneven, fix them. If there are any pets around you, be aware of where they are. Review your medicines with your doctor. Some medicines can make you feel dizzy. This can increase your chance of falling. Ask your doctor what other things that you can do to help prevent falls. This information is not intended to replace advice given to you by your health care provider. Make sure you discuss any questions you have with your health care provider. Document Released: 03/30/2009 Document Revised: 11/09/2015 Document Reviewed: 07/08/2014 Elsevier Interactive Patient Education  2017 [employer].

## 2021-12-24 NOTE — Progress Notes (Signed)
Subjective:   Amanda Moss is a 66 y.o. female who presents for Medicare Annual (Subsequent) preventive examination.  Review of Systems     Cardiac Risk Factors include: advanced age (>86mn, >>50women);hypertension     Objective:    Today's Vitals   12/24/21 0954  BP: 120/62  Pulse: 74  Temp: 98 F (36.7 C)  TempSrc: Oral  SpO2: 96%  Weight: 126 lb 8 oz (57.4 kg)  Height: 5' 0.5" (1.537 m)   Body mass index is 24.3 kg/m.     12/24/2021   10:07 AM 12/21/2020   11:30 AM  Advanced Directives  Does Patient Have a Medical Advance Directive? Yes Yes  Type of AParamedicof AOakleyLiving will HBrookLiving will  Does patient want to make changes to medical advance directive? No - Patient declined   Copy of [place] Chart? No - copy requested No - copy requested    Current Medications (verified) Outpatient Encounter Medications as of 12/24/2021  Medication Sig   amLODipine (NORVASC) 5 MG tablet Take 1 and 1/2 tablets by mouth once daily   budesonide (PULMICORT) 0.5 MG/2ML nebulizer solution Mix one ampule (1 dose) to 2414mof saline in rinse bottle; irrigate sinuses with 120 mL through each nostril daily. QTY: 30 day supply   Cholecalciferol (VITAMIN D PO) Take by mouth.   clonazePAM (KLONOPIN) 0.5 MG tablet Take 1 tablet (0.5 mg total) by mouth daily as needed for anxiety.   Multiple Vitamins-Minerals (PRESERVISION AREDS PO) Take 1 tablet by mouth daily.    No facility-administered encounter medications on file as of 12/24/2021.    Allergies (verified) Codeine sulfate   History: Past Medical History:  Diagnosis Date   Bleeding 2003   hemorrhoidal bleeding   Hypertension    Squamous cell cancer of skin of nose    per patient treated by Dr NeTrixie DeisAIN, RIGHT 06/20/2010   Qualifier: Diagnosis of  By: HoElizebeth KollerD, ThMora Appl   Tobacco use    Tobacco use disorder 09/21/2014   Vitamin D deficiency     Past Surgical History:  Procedure Laterality Date   CESAREAN SECTION     COSMETIC SURGERY  2001   eyes   NASAL RECONSTRUCTION     RHINOPLASTY     SQUAMOUS CELL CARCINOMA EXCISION     on nose; require reconstruction   Family History  Problem Relation Age of Onset   Dementia Mother    Cancer Father [id number]     lung   Hypertension Father    Pneumonia Father [id number] Prostate cancer Brother    Diabetes Neg Hx    Social History   Socioeconomic History   Marital status: Divorced    Spouse name: Not on file   Number of children: 1   Years of education: Not on file   Highest education level: Not on file  Occupational History   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  Tobacco Use   Smoking status: Former    Packs/day: 0.50    Years: 40.00    Total pack years: 20.00    Types: Cigarettes    Quit date: 08/2018    Years since quitting: 3.3   Smokeless tobacco: Never  Substance and Sexual Activity   Alcohol use: Yes    Alcohol/week: 0.0 standard drinks of alcohol    Comment: 2-3 drinks a few days per  week   Drug use: No   Sexual activity: Not on file  Other Topics Concern   Not on file  Social History Narrative   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 [employer]:  Lives alone      Spiritual Beliefs: Catholic      Lifestyle: walking; diet is healthy      Social Determinants of Health   Financial Resource Strain: Low Risk  (12/24/2021)   Overall Financial Resource Strain (CARDIA)    Difficulty of Paying Living Expenses: Not hard at all  Food Insecurity: No Food Insecurity (12/24/2021)   Hunger Vital Sign    Worried About Running Out of Food in the Last Year: Never true    [place] in the Last Year: Never true  Transportation Needs: No Transportation Needs (12/24/2021)   PRAPARE - Hydrologist (Medical): No    Lack of Transportation (Non-Medical): No  Physical  Activity: Insufficiently Active (12/24/2021)   Exercise Vital Sign    Days of Exercise per Week: 3 days    Minutes of Exercise per Session: 40 min  Stress: No Stress Concern Present (12/24/2021)   [place]    Feeling of Stress : Not at all  Social Connections: Moderately Isolated (12/24/2021)   Social Connection and Isolation Panel [NHANES]    Frequency of Communication with Friends and Family: More than three times a week    Frequency of Social Gatherings with Friends and Family: More than three times a week    Attends Religious Services: Never    Marine scientist or Organizations: Yes    Attends Music therapist: More than 4 times per year    Marital Status: Divorced     Clinical Intake:   Diabetic?  No  Interpreter Needed?: No Activities of Daily Living    12/24/2021   10:04 AM  In your present state of health, do you have any difficulty performing the following activities:  Hearing? 0  Vision? 0  Difficulty concentrating or making decisions? 0  Walking or climbing stairs? 0  Dressing or bathing? 0  Doing errands, shopping? 0  Preparing Food and eating ? N  Using the Toilet? N  In the past six months, have you accidently leaked urine? N  Do you have problems with loss of bowel control? N  Managing your Medications? N  Managing your Finances? N  Housekeeping or managing your Housekeeping? N    Patient Care Team: Caren Macadam, MD (Inactive) as PCP - General (Family Medicine) Molli Posey, MD as Consulting Physician (Obstetrics and Gynecology)  Indicate any recent Medical Services you may have received from other than [facility] providers in the past year (date may be approximate).     Assessment:   This is a routine wellness examination for [employer].  Hearing/Vision screen Hearing Screening - Comments:: No hearing difficulty Vision Screening - Comments:: Wears contacts.  Followed by Dr Jerline Pain  Dietary issues and exercise activities discussed: Exercise limited by: None identified   Goals Addressed               This Visit's Progress     No current goals (pt-stated)         Depression Screen    12/24/2021   10:02 AM 09/14/2021    3:21 PM 12/21/2020   11:32 AM  12/21/2020   11:29 AM 02/09/2020   11:21 AM 05/31/2019    8:22 AM 02/19/2019   12:01 PM  PHQ 2/9 Scores  PHQ - 2 Score 0 1 0 0 2 0 0  PHQ- 9 Score     8      Fall Risk    12/24/2021   10:06 AM 09/14/2021    3:17 PM 12/21/2020   11:31 AM 09/06/2020    9:55 AM  Fall Risk   Falls in the past year? 0 0 0 0  Number falls in past yr: 0 0 0 0  Injury with Fall? 0 0 0   Risk for fall due to : No Fall Risks No Fall Risks    Follow up  Falls evaluation completed Falls evaluation completed     FALL RISK PREVENTION PERTAINING TO THE HOME:  Any stairs in or around the home? Yes  If so, are there any without handrails? No Home free of loose throw rugs in walkways, pet beds, electrical cords, etc? Yes Adequate lighting in your home to reduce risk of falls? Yes   ASSISTIVE DEVICES UTILIZED TO PREVENT FALLS:  Life alert? No  Use of a cane, walker or w/c? No  Grab bars in the bathroom? No  Shower chair or bench in shower? No  Elevated toilet seat or a handicapped toilet? No   TIMED UP AND GO:  Was the test performed? Yes .  Length of time to ambulate 10 feet: 5 sec.   Gait steady and fast without use of assistive device  Cognitive Function:      Immunizations Immunization History  Administered Date(s) Administered   Influenza-Unspecified 03/17/2014, 03/18/2015, 04/17/2018   PFIZER(Purple Top)SARS-COV-2 Vaccination 07/31/2019, 08/25/2019, 03/17/2020   PNEUMOCOCCAL CONJUGATE-20 09/06/2020   Tdap 03/02/2014   Zoster Recombinat (Shingrix) 08/20/2021, 11/11/2021   Zoster, Live 09/21/2014    TDAP status: Up to date    Pneumococcal vaccine status: Up to date  Covid-19 vaccine  status: Completed vaccines  Qualifies for Shingles Vaccine? Yes   Zostavax completed Yes   Shingrix Completed?: Yes  Screening Tests Health Maintenance  Topic Date Due   COVID-19 Vaccine (4 - Booster for Pfizer series) 01/09/2022 (Originally 05/12/2020)   INFLUENZA VACCINE  01/15/2022   MAMMOGRAM  02/26/2022   COLONOSCOPY (Pts 45-102yr Insurance coverage will need to be confirmed)  05/26/2023   TETANUS/TDAP  03/02/2024   Pneumonia Vaccine 67 Years old  Completed   DEXA SCAN  Completed   Hepatitis C Screening  Completed   Zoster Vaccines- Shingrix  Completed   HPV VACCINES  Aged Out    Health Maintenance  There are no preventive care reminders to display for this patient.   Colorectal cancer screening: Type of screening: Colonoscopy. Completed 05/25/18. Repeat every 5 years  Mammogram status: Completed 02/26/21. Repeat every year  Bone Density status: Completed 06/17/17. Results reflect: Bone density results: OSTEOPENIA. Repeat every   years.  Lung Cancer Screening: (Low Dose CT Chest recommended if Age 67-80years, 30 pack-year currently smoking OR have quit w/in 15years.) does not qualify.     Additional Screening:  Hepatitis C Screening: does qualify; Completed 07/11/16  Vision Screening: Recommended annual ophthalmology exams for early detection of glaucoma and other disorders of the eye. Is the patient up to date with their annual eye exam?  Yes  Who is the provider or what is the name of the office in which the patient attends annual eye exams? Dr PJerline PainIf pt is not  established with a provider, would they like to be referred to a provider to establish care? No .   Dental Screening: Recommended annual dental exams for proper oral hygiene  Community Resource Referral / Chronic Care Management:  CRR required this visit?  No   CCM required this visit?  No      Plan:     I have personally reviewed and noted the following in the patient's chart:   Medical and  social history Use of alcohol, tobacco or illicit drugs  Current medications and supplements including opioid prescriptions.  Functional ability and status Nutritional status Physical activity Advanced directives List of other physicians Hospitalizations, surgeries, and ER visits in previous 12 months Vitals Screenings to include cognitive, depression, and falls Referrals and appointments  In addition, I have reviewed and discussed with patient certain preventive protocols, quality metrics, and best practice recommendations. A written personalized care plan for preventive services as well as general preventive health recommendations were provided to patient.     Criselda Peaches, LPN   1/[id number]   Nurse Notes: None

## 2021-12-27 DIAGNOSIS — L570 Actinic keratosis: Secondary | ICD-10-CM | POA: Diagnosis not present

## 2021-12-27 DIAGNOSIS — L819 Disorder of pigmentation, unspecified: Secondary | ICD-10-CM | POA: Diagnosis not present

## 2021-12-27 DIAGNOSIS — L821 Other seborrheic keratosis: Secondary | ICD-10-CM | POA: Diagnosis not present

## 2021-12-27 DIAGNOSIS — D485 Neoplasm of uncertain behavior of skin: Secondary | ICD-10-CM | POA: Diagnosis not present

## 2021-12-27 DIAGNOSIS — D225 Melanocytic nevi of trunk: Secondary | ICD-10-CM | POA: Diagnosis not present

## 2021-12-27 DIAGNOSIS — D692 Other nonthrombocytopenic purpura: Secondary | ICD-10-CM | POA: Diagnosis not present

## 2021-12-27 DIAGNOSIS — Z85828 Personal history of other malignant neoplasm of skin: Secondary | ICD-10-CM | POA: Diagnosis not present

## 2021-12-27 DIAGNOSIS — D2262 Melanocytic nevi of left upper limb, including shoulder: Secondary | ICD-10-CM | POA: Diagnosis not present

## 2021-12-27 DIAGNOSIS — D2261 Melanocytic nevi of right upper limb, including shoulder: Secondary | ICD-10-CM | POA: Diagnosis not present

## 2021-12-27 DIAGNOSIS — L720 Epidermal cyst: Secondary | ICD-10-CM | POA: Diagnosis not present

## 2022-01-09 DIAGNOSIS — Z4802 Encounter for removal of sutures: Secondary | ICD-10-CM | POA: Diagnosis not present

## 2022-01-15 ENCOUNTER — Other Ambulatory Visit: Payer: Self-pay | Admitting: Acute Care

## 2022-01-15 ENCOUNTER — Other Ambulatory Visit: Payer: Self-pay | Admitting: Pediatric Radiology

## 2022-01-15 DIAGNOSIS — Z1231 Encounter for screening mammogram for malignant neoplasm of breast: Secondary | ICD-10-CM

## 2022-01-16 DIAGNOSIS — C300 Malignant neoplasm of nasal cavity: Secondary | ICD-10-CM | POA: Diagnosis not present

## 2022-01-16 DIAGNOSIS — J3489 Other specified disorders of nose and nasal sinuses: Secondary | ICD-10-CM | POA: Diagnosis not present

## 2022-01-22 DIAGNOSIS — L0889 Other specified local infections of the skin and subcutaneous tissue: Secondary | ICD-10-CM | POA: Diagnosis not present

## 2022-01-22 DIAGNOSIS — L0201 Cutaneous abscess of face: Secondary | ICD-10-CM | POA: Diagnosis not present

## 2022-02-27 ENCOUNTER — Ambulatory Visit
Admission: RE | Admit: 2022-02-27 | Discharge: 2022-02-27 | Disposition: A | Discharge status: Home / Self Care | Payer: Medicare HMO | Source: Ambulatory Visit | Attending: Acute Care | Admitting: Acute Care

## 2022-02-27 DIAGNOSIS — Z1231 Encounter for screening mammogram for malignant neoplasm of breast: Secondary | ICD-10-CM | POA: Diagnosis not present

## 2022-03-01 DIAGNOSIS — H524 Presbyopia: Secondary | ICD-10-CM | POA: Diagnosis not present

## 2022-03-01 DIAGNOSIS — H43811 Vitreous degeneration, right eye: Secondary | ICD-10-CM | POA: Diagnosis not present

## 2022-03-01 DIAGNOSIS — H35033 Hypertensive retinopathy, bilateral: Secondary | ICD-10-CM | POA: Diagnosis not present

## 2022-03-01 DIAGNOSIS — H353132 Nonexudative age-related macular degeneration, bilateral, intermediate dry stage: Secondary | ICD-10-CM | POA: Diagnosis not present

## 2022-03-01 DIAGNOSIS — H2513 Age-related nuclear cataract, bilateral: Secondary | ICD-10-CM | POA: Diagnosis not present

## 2022-03-06 ENCOUNTER — Ambulatory Visit (INDEPENDENT_AMBULATORY_CARE_PROVIDER_SITE_OTHER): Payer: Medicare HMO | Admitting: Family Medicine

## 2022-03-06 ENCOUNTER — Encounter: Payer: Self-pay | Admitting: Family Medicine

## 2022-03-06 VITALS — BP 120/78 | HR 85 | Temp 98.3°F | Ht 60.5 in | Wt 130.3 lb

## 2022-03-06 DIAGNOSIS — I10 Essential (primary) hypertension: Secondary | ICD-10-CM | POA: Diagnosis not present

## 2022-03-06 DIAGNOSIS — N393 Stress incontinence (female) (male): Secondary | ICD-10-CM

## 2022-03-06 NOTE — Progress Notes (Signed)
Established Patient Office Visit  Subjective   Patient ID: Amanda Moss, female    DOB: 06/05/1956  Age: 66 y.o. MRN: [id number]  No chief complaint on file.   Patient is here for transition of care visit. Patient reports she is feeling well overall, states that she states that she is having some trouble "making it" to the bathroom in time. Reports leakage with sitting, standing and coughing. States she tried doing the pelvic floor exercises at home and they haven't been effective.   HTN -- BP in office performed and is well controlled. She reports no side effects to the medications, no chest pain, SOB, dizziness or headaches. She has a BP cuff at home and is checking her BP regularly, reports they are in the normal range.    Abnormal pap-- pap showed hyperkeratosis in March 2023, patient states she has an appointment in December with her GYN to repeat this pap smear. Has had a history of abnormal paps in the past and had to undergo a LEEP procedure in the past. We reviewed the pap smear results which were negative for lesion or malignancy but did comment on hyperkeratosis.   Current Outpatient Medications  Medication Instructions   amLODipine (NORVASC) 5 MG tablet Take 1 and 1/2 tablets by mouth once daily   budesonide (PULMICORT) 0.5 MG/2ML nebulizer solution Mix one ampule (1 dose) to 265m of saline in rinse bottle; irrigate sinuses with 120 mL through each nostril daily. QTY: 30 day supply   Cholecalciferol (VITAMIN D PO) Oral   clonazePAM (KLONOPIN) 0.5 mg, Oral, Daily PRN   Multiple Vitamins-Minerals (PRESERVISION AREDS PO) 1 tablet, Oral, Daily    Patient Active Problem List   Diagnosis Date Noted   Vitamin D deficiency 02/27/2016   Osteopenia 02/27/2016   Essential hypertension 09/21/2014      Review of Systems  All other systems reviewed and are negative.     Objective:     BP 120/78 (BP Location: Left Arm, Patient Position: Sitting, Cuff Size: Normal)   Pulse 85    Temp 98.3 F (36.8 C) (Oral)   Ht 5' 0.5" (1.537 m)   Wt 130 lb 4.8 oz (59.1 kg)   SpO2 98%   BMI 25.03 kg/m  BP Readings from Last 3 Encounters:  03/06/22 120/78  12/24/21 120/62  09/14/21 122/82      Physical Exam Constitutional:      Appearance: Normal appearance. She is normal weight.  Eyes:     Conjunctiva/sclera: Conjunctivae normal.  Neck:     Thyroid: No thyromegaly.  Cardiovascular:     Rate and Rhythm: Normal rate and regular rhythm.     Pulses: Normal pulses.     Heart sounds: Normal heart sounds.  Pulmonary:     Effort: Pulmonary effort is normal.     Breath sounds: Normal breath sounds. No wheezing or rhonchi.  Abdominal:     General: Abdomen is flat. Bowel sounds are normal.     Palpations: Abdomen is soft.  Musculoskeletal:     Cervical back: Normal range of motion.  Neurological:     Mental Status: She is alert.      No results found for any visits on 03/06/22.  Last metabolic panel Lab Results  Component Value Date   GLUCOSE 88 09/19/2021   NA 137 09/19/2021   K 4.4 09/19/2021   CL 102 09/19/2021   CO2 27 09/19/2021   BUN 21 09/19/2021   CREATININE 0.60 09/19/2021   CALCIUM  9.7 09/19/2021   PROT 7.0 09/19/2021   ALBUMIN 4.4 09/19/2021   BILITOT 0.4 09/19/2021   ALKPHOS 101 09/19/2021   AST 19 09/19/2021   ALT 19 09/19/2021   Last lipids Lab Results  Component Value Date   CHOL 170 09/19/2021   HDL 57.50 09/19/2021   LDLCALC 95 09/19/2021   TRIG 85.0 09/19/2021   CHOLHDL 3 09/19/2021      The 10-year ASCVD risk score (Arnett DK, et al., 2019) is: 7.5%    Assessment & Plan:   Problem List Items Addressed This Visit       Cardiovascular and Mediastinum   Essential hypertension - Primary    On amlodipine 7.5 mg daily, doing well with this medication, BP performed in office today and is well controlled. Will continue this as prescribed.      Other Visit Diagnoses     Stress incontinence       Relevant Orders    Ambulatory referral to Physical Therapy     Will refer patient to physical therapy for pelvic floor therapy. Advised regularly scheduled bathroom breaks to keep the bladder empty. Return in about 6 months (around 09/16/2022) for Annual physical exam.    Farrel Conners, MD

## 2022-03-06 NOTE — Assessment & Plan Note (Signed)
On amlodipine 7.5 mg daily, doing well with this medication, BP performed in office today and is well controlled. Will continue this as prescribed.

## 2022-03-06 NOTE — Patient Instructions (Addendum)
Florastor or Align at two brands of probiotics.  RSV, COVID booster, high dose Flu vaccines are recommended - you may go to any pharmacy in the area.

## 2022-03-11 NOTE — Therapy (Unsigned)
OUTPATIENT PHYSICAL THERAPY FEMALE PELVIC EVALUATION   Patient Name: Amanda Moss MRN: [id number] DOB:20-Jun-1955, 66 y.o., female Today's Date: 03/13/2022   PT End of Session - 03/13/22 0834     Visit Number 1    Date for PT Re-Evaluation 06/05/22    Authorization Type aetna medicare    Authorization - Visit Number 1    Authorization - Number of Visits 10    PT Start Time 0800    PT Stop Time 0838    PT Time Calculation (min) 38 min    Activity Tolerance Patient tolerated treatment well    Behavior During Therapy [facility] for tasks assessed/performed             Past Medical History:  Diagnosis Date   Bleeding 2003   hemorrhoidal bleeding   Hypertension    Squamous cell cancer of skin of nose    per patient treated by Dr Trixie Deis PAIN, RIGHT 06/20/2010   Qualifier: Diagnosis of  By: Elizebeth Koller MD, Mora Appl     Tobacco use    Tobacco use disorder 09/21/2014   Vitamin D deficiency    Past Surgical History:  Procedure Laterality Date   CESAREAN SECTION     COSMETIC SURGERY  2001   eyes   NASAL RECONSTRUCTION     RHINOPLASTY     SQUAMOUS CELL CARCINOMA EXCISION     on nose; require reconstruction   Patient Active Problem List   Diagnosis Date Noted   Vitamin D deficiency 02/27/2016   Osteopenia 02/27/2016   Essential hypertension 09/21/2014    PCP: Farrel Conners, MD  REFERRING PROVIDER: Farrel Conners, MD  REFERRING DIAG: N39.3 (ICD-10-CM) - Stress incontinence  THERAPY DIAG:  Muscle weakness (generalized)  Other lack of coordination  Stress incontinence  Rationale for Evaluation and Treatment Rehabilitation  ONSET DATE: 2022  SUBJECTIVE:                                                                                                                                                                                           SUBJECTIVE STATEMENT: Always feels like she has to go. Does not make it to the bathroom in time.  Fluid intake: Yes: couple  cups of coffee in the morning, diet coke, water, wine     PAIN:  Are you having pain? No   PRECAUTIONS: Other: skin cancer  WEIGHT BEARING RESTRICTIONS No  FALLS:  Has patient fallen in last 6 months? No  LIVING ENVIRONMENT: Lives with: lives alone  OCCUPATION: retire, freelance sitting; yoga  PLOF: Independent  PATIENT GOALS reduce leakage  PERTINENT HISTORY:  C-section; skin cancer  BOWEL MOVEMENT Pain with bowel movement: No Type of bowel movement:Type (Bristol Stool Scale) initially Type 1 then 6 or 7, Frequency daily, Strain No, and Splinting no Fully empty rectum: No Leakage: No Fiber supplement: No  URINATION Pain with urination: No Fully empty bladder: Yes:   Stream:  average Urgency: Yes:   Frequency: 2-3 hours; no leakage at night Leakage: urge to void, walking to the bathroom, key in door, getting into the store Pads: poise light 1 per day  INTERCOURSE; no sexually active  PREGNANCY C-section deliveries 1  PROLAPSE None    OBJECTIVE:   DIAGNOSTIC FINDINGS:  none   COGNITION:  Overall cognitive status: Within functional limits for tasks assessed     SENSATION:  Light touch: Appears intact  Proprioception: Appears intact                POSTURE: decreased lumbar lordosis   PELVIC ALIGNMENT:equal  LUMBARAROM/PROM  A/PROM A/PROM  eval  Flexion Decreased by 25%  Extension Decreased by 50%  Right lateral flexion Decreased by 25%  Left lateral flexion Decreased by 25%  Right rotation Decreased by 25%  Left rotation Decreased by 25%   (Blank rows = not tested)  LOWER EXTREMITY [id number] hip ROM is full    LOWER EXTREMITY MMT:  MMT Right eval Left eval  Hip extension 5/5 4/5   Hip abduction 4/5 4-/5  Hip adduction 4/5 4/5    PALPATION:   General  contracts the upper abdominals not the lower; restrictions on the c-section scar, reductio of left gluteal tightening                External Perineal Exam intact                              Internal Pelvic Floor tightness on the sides of the introitus and perineal body  Patient confirms identification and approves PT to assess internal pelvic floor and treatment Yes  PELVIC MMT:   MMT eval  Vaginal 2/5 anterior and posterior, 1/5 laterally, holding 2 sec  (Blank rows = not tested)        TONE: average  PROLAPSE: none  TODAY'S TREATMENT  EVAL Date: 03/13/2022 HEP established-see below    PATIENT EDUCATION:  Education details: educated patient on bladder irritants, massaging the perineal body, gave patient lubricant to do the manual work Person educated: Patient Education method: Customer service manager Education comprehension: verbalized understanding   HOME EXERCISE PROGRAM: See above.   ASSESSMENT:  CLINICAL IMPRESSION: Patient is a 66 y.o. female who was seen today for physical therapy evaluation and treatment for stress incontinence. Patient reports urinary leakage for the past year with the urge to void, walking to the bathroom and key in the door.  Patient wears a light poise pad and uses 1 per day. She drinks coffee, wine, diet soda and a little water. Pelvic floor strength is 2/5 anterior and posterior with 1/5 laterally. She has tightness on the perineal body and along the sides of the introitus. She has weakness in bilateral hip extension, abduction, and adduction. She will contract her upper abdominals and not the lower. Patient has restrictions in the c-section scar. Patient will benefit from skilled therapy to work on pelvic floor coordination and strength to reduce urinary leakage.    OBJECTIVE IMPAIRMENTS decreased activity tolerance, decreased coordination, decreased endurance, decreased strength, and increased fascial restrictions.   ACTIVITY LIMITATIONS continence  PARTICIPATION LIMITATIONS: community activity  PERSONAL FACTORS Fitness and 1 comorbidity: c-section  are also affecting patient's functional outcome.    REHAB POTENTIAL: Excellent  CLINICAL DECISION MAKING: Stable/uncomplicated  EVALUATION COMPLEXITY: Low   GOALS: Goals reviewed with patient? Yes  SHORT TERM GOALS: Target date: 04/10/2022  Patient educated on bladder irritants and how they effect the bladder.  Baseline: Goal status: INITIAL  2.  Patient educated on perineal massage to reduce muscle tension.  Baseline:  Goal status: INITIAL  3.  Patient able to contract the upper and lower abdomen together to incorporate the pelvic floor muscles.  Baseline:  Goal status: INITIAL  4.  Patient educated on the urge to void to reduce the urgency to get to the bathroom.  Baseline:  Goal status: INITIAL  LONG TERM GOALS: Target date: 06/05/2022   Patient independent with advanced HEP for core and pelvic floor strength . Baseline:  Goal status: INITIAL  2.  Pelvic floor strength >/= 3/5 with circular hug to reduce urinary leakage so she does not have to wear a pad.  Baseline:  Goal status: INITIAL  3.  Patient able to walk to the bathroom without urinary leakage due to increased control of the pelvic floor and good circular contraction.  Baseline:  Goal status: INITIAL  4.  Patient urgency reduced >/= 90% so she is able to walk into her home from being out and walk slowly to the bathroom.  Baseline:  Goal status: INITIAL   PLAN: PT FREQUENCY: 1x/week  PT DURATION: 12 weeks  PLANNED INTERVENTIONS: Therapeutic exercises, Therapeutic activity, Neuromuscular re-education, Patient/Family education, Self Care, Joint mobilization, Dry Needling, Spinal mobilization, scar mobilization, Biofeedback, and Manual therapy  PLAN FOR NEXT SESSION: manual work to the sides of the introitus and perineal body, scar massage to c-section scar, diaphragmatic breathing, review bladder irritants.    Earlie Counts, PT 03/13/22 9:45 AM

## 2022-03-13 ENCOUNTER — Encounter: Payer: Self-pay | Admitting: Physical Therapy

## 2022-03-13 ENCOUNTER — Other Ambulatory Visit: Payer: Self-pay

## 2022-03-13 ENCOUNTER — Ambulatory Visit: Payer: Medicare HMO | Attending: Family Medicine | Admitting: Physical Therapy

## 2022-03-13 DIAGNOSIS — R278 Other lack of coordination: Secondary | ICD-10-CM | POA: Insufficient documentation

## 2022-03-13 DIAGNOSIS — M6281 Muscle weakness (generalized): Secondary | ICD-10-CM | POA: Diagnosis not present

## 2022-03-13 DIAGNOSIS — N393 Stress incontinence (female) (male): Secondary | ICD-10-CM | POA: Insufficient documentation

## 2022-03-29 ENCOUNTER — Ambulatory Visit: Payer: Medicare HMO | Attending: Family Medicine | Admitting: Physical Therapy

## 2022-03-29 ENCOUNTER — Encounter: Payer: Self-pay | Admitting: Physical Therapy

## 2022-03-29 DIAGNOSIS — M6281 Muscle weakness (generalized): Secondary | ICD-10-CM | POA: Diagnosis not present

## 2022-03-29 DIAGNOSIS — R278 Other lack of coordination: Secondary | ICD-10-CM | POA: Insufficient documentation

## 2022-03-29 DIAGNOSIS — N393 Stress incontinence (female) (male): Secondary | ICD-10-CM | POA: Diagnosis not present

## 2022-03-29 NOTE — Therapy (Signed)
OUTPATIENT PHYSICAL THERAPY TREATMENT NOTE   Patient Name: Amanda Moss MRN: [id number] DOB:[birth year], 66 y.o., female Today's Date: 03/29/2022  PCP: Farrel Conners, MD REFERRING PROVIDER: Farrel Conners, MD  END OF SESSION:   PT End of Session - 03/29/22 0801     Visit Number 2    Date for PT Re-Evaluation 06/05/22    Authorization Type aetna medicare    Authorization - Visit Number 2    Authorization - Number of Visits 10    PT Start Time 0800    PT Stop Time 0840    PT Time Calculation (min) 40 min    Activity Tolerance Patient tolerated treatment well    Behavior During Therapy [facility] for tasks assessed/performed             Past Medical History:  Diagnosis Date   Bleeding 2003   hemorrhoidal bleeding   Hypertension    Squamous cell cancer of skin of nose    per patient treated by Dr Trixie Deis PAIN, RIGHT 06/20/2010   Qualifier: Diagnosis of  By: Elizebeth Koller MD, Mora Appl     Tobacco use    Tobacco use disorder 09/21/2014   Vitamin D deficiency    Past Surgical History:  Procedure Laterality Date   CESAREAN SECTION     COSMETIC SURGERY  2001   eyes   NASAL RECONSTRUCTION     RHINOPLASTY     SQUAMOUS CELL CARCINOMA EXCISION     on nose; require reconstruction   Patient Active Problem List   Diagnosis Date Noted   Vitamin D deficiency 02/27/2016   Osteopenia 02/27/2016   Essential hypertension 09/21/2014   REFERRING DIAG: N39.3 (ICD-10-CM) - Stress incontinence   THERAPY DIAG:  Muscle weakness (generalized)   Other lack of coordination   Stress incontinence   Rationale for Evaluation and Treatment Rehabilitation   ONSET DATE: 2022   SUBJECTIVE:                                                                                                                                                                                            SUBJECTIVE STATEMENT: Bowel movements are better. I have massaged  pelvic floor muscles several times.  Fluid  intake: Yes: couple cups of coffee in the morning, diet coke, water, wine       PAIN:  Are you having pain? No     PRECAUTIONS: Other: skin cancer   WEIGHT BEARING RESTRICTIONS No   FALLS:  Has patient fallen in last 6 months? No   LIVING ENVIRONMENT: Lives with: lives alone   OCCUPATION: retire, freelance sitting;  yoga   PLOF: Independent   PATIENT GOALS reduce leakage   PERTINENT HISTORY:  C-section; skin cancer   BOWEL MOVEMENT Pain with bowel movement: No Type of bowel movement:Type (Bristol Stool Scale) initially Type 1 then 6 or 7, Frequency daily, Strain No, and Splinting no Fully empty rectum: No Leakage: No Fiber supplement: No   URINATION Pain with urination: No Fully empty bladder: Yes:   Stream:  average Urgency: Yes:   Frequency: 2-3 hours; no leakage at night Leakage: urge to void, walking to the bathroom, key in door, getting into the store Pads: poise light 1 per day   INTERCOURSE; no sexually active   PREGNANCY C-section deliveries 1   PROLAPSE None       OBJECTIVE:    DIAGNOSTIC FINDINGS:  none     COGNITION:            Overall cognitive status: Within functional limits for tasks assessed                          SENSATION:            Light touch: Appears intact            Proprioception: Appears intact                  POSTURE: decreased lumbar lordosis               PELVIC ALIGNMENT:equal   LUMBARAROM/PROM   A/PROM A/PROM  eval  Flexion Decreased by 25%  Extension Decreased by 50%  Right lateral flexion Decreased by 25%  Left lateral flexion Decreased by 25%  Right rotation Decreased by 25%  Left rotation Decreased by 25%   (Blank rows = not tested)   LOWER EXTREMITY HBZ:[id number] hip ROM is full       LOWER EXTREMITY MMT:   MMT Right eval Left eval  Hip extension 5/5 4/5   Hip abduction 4/5 4-/5  Hip adduction 4/5 4/5     PALPATION:   General  contracts the upper abdominals not the lower; restrictions  on the c-section scar, reductio of left gluteal tightening                 External Perineal Exam intact                             Internal Pelvic Floor tightness on the sides of the introitus and perineal body   Patient confirms identification and approves PT to assess internal pelvic floor and treatment Yes   PELVIC MMT:   MMT eval  Vaginal 2/5 anterior and posterior, 1/5 laterally, holding 2 sec  (Blank rows = not tested)         TONE: average   PROLAPSE: none   TODAY'S TREATMENT  03/29/2022 Manual: Scar tissue mobilization:manual work to the c-section scar with going through the restrictions and lifting the tissue Myofascial release:fascial release along the left lower abdominal area Neuromuscular re-education: Pelvic floor contraction training:hookly contract the lower abdominal with pelvic floor while squeezing the ball 15 times and tactile cues to the lower abdomen Hookly with isometric hip flexion to engage the lower abdomen holding 5 sec 5 times each side.  Self-care: Discussed with patient on not drinking irritants 2 hours prior to bedtime.  Educated patient further on what bladder irritants are and how they affect the pelvic floor.     EVAL  Date: 03/13/2022 HEP established-see below     PATIENT EDUCATION: 03/29/2022 Education details: Access Code: [email or web address], educated patient on bladder irritants and scar massage Person educated: Patient Education method: Explanation, Demonstration, Tactile cues, Verbal cues, and Handouts Education comprehension: verbalized understanding, returned demonstration, verbal cues required, tactile cues required, and needs further education      HOME EXERCISE PROGRAM: 03/29/2022 Access Code: [email or web address] URL: [email or web address] Date: 03/29/2022 Prepared by: Earlie Counts  Exercises - Hooklying Transversus Abdominis Palpation  - 1 x daily - 7 x weekly - 1 sets - 10 reps - Hooklying Isometric Hip Flexion  - 1 x daily  - 7 x weekly - 1 sets - 5 reps - 5 sec hold   ASSESSMENT:   CLINICAL IMPRESSION: Patient is a 66 y.o. female who was seen today for physical therapy  treatment for stress incontinence. Patient is able to contract the lower abdominals with ball squeeze and hip isometric. Patient understands what bladder irritants are and she is drinking more water. Patient has restrictions in the c-section scar. Patient will benefit from skilled therapy to work on pelvic floor coordination and strength to reduce urinary leakage.      OBJECTIVE IMPAIRMENTS decreased activity tolerance, decreased coordination, decreased endurance, decreased strength, and increased fascial restrictions.    ACTIVITY LIMITATIONS continence   PARTICIPATION LIMITATIONS: community activity   PERSONAL FACTORS Fitness and 1 comorbidity: c-section  are also affecting patient's functional outcome.    REHAB POTENTIAL: Excellent   CLINICAL DECISION MAKING: Stable/uncomplicated   EVALUATION COMPLEXITY: Low     GOALS: Goals reviewed with patient? Yes   SHORT TERM GOALS: Target date: 04/10/2022   Patient educated on bladder irritants and how they effect the bladder.  Baseline: Goal status: Met 03/29/2022   2.  Patient educated on perineal massage to reduce muscle tension.  Baseline:  Goal status: Met 03/29/2022   3.  Patient able to contract the upper and lower abdomen together to incorporate the pelvic floor muscles.  Baseline:  Goal status: INITIAL   4.  Patient educated on the urge to void to reduce the urgency to get to the bathroom.  Baseline:  Goal status: INITIAL   LONG TERM GOALS: Target date: 06/05/2022    Patient independent with advanced HEP for core and pelvic floor strength . Baseline:  Goal status: INITIAL   2.  Pelvic floor strength >/= 3/5 with circular hug to reduce urinary leakage so she does not have to wear a pad.  Baseline:  Goal status: INITIAL   3.  Patient able to walk to the bathroom  without urinary leakage due to increased control of the pelvic floor and good circular contraction.  Baseline:  Goal status: INITIAL   4.  Patient urgency reduced >/= 90% so she is able to walk into her home from being out and walk slowly to the bathroom.  Baseline:  Goal status: INITIAL     PLAN: PT FREQUENCY: 1x/week   PT DURATION: 12 weeks   PLANNED INTERVENTIONS: Therapeutic exercises, Therapeutic activity, Neuromuscular re-education, Patient/Family education, Self Care, Joint mobilization, Dry Needling, Spinal mobilization, scar mobilization, Biofeedback, and Manual therapy   PLAN FOR NEXT SESSION: manual work to the sides of the introitus and perineal body, scar massage to c-section scar give patient handout, progress abdominal exercise   Earlie Counts, PT 03/29/22 8:45 AM

## 2022-03-29 NOTE — Patient Instructions (Signed)
Bladder Irritants  Certain foods and beverages can be irritating to the bladder.  Avoiding these irritants may decrease your symptoms of urinary urgency, frequency or bladder pain.  Even reducing your intake can help with your symptoms.  Not everyone is sensitive to all bladder irritants, so you may consider focusing on one irritant at a time, removing or reducing your intake of that irritant for 7-10 days to see if this change helps your symptoms.  Water intake is also very important.  Below is a list of bladder irritants.  Drinks: alcohol, carbonated beverages, caffeinated beverages such as coffee and tea, drinks with artificial sweeteners, citrus juices, apple juice, tomato juice  Foods: tomatoes and tomato based foods, spicy food, sugar and artificial sweeteners, vinegar, chocolate, raw onion, apples, citrus fruits, pineapple, cranberries, tomatoes, strawberries, plums, peaches, cantaloupe  Other: acidic urine (too concentrated) - see water intake info below  Substitutes you can try that are NOT irritating to the bladder: cooked onion, pears, papayas, sun-brewed decaf teas, watermelons, non-citrus herbal teas, apricots, kava and low-acid instant drinks (Postum).    WATER INTAKE: Remember to drink lots of water (aim for fluid intake of half your body weight with 2/3 of fluids being water).  You may be limiting fluids due to fear of leakage, but this can actually worsen urgency symptoms due to highly concentrated urine.  Water helps balance the pH of your urine so it doesn't become too acidic - acidic urine is a bladder irritant  [facility] [address] [place], [place] [address] Phone # [phone] Fax [phone]

## 2022-04-05 ENCOUNTER — Encounter: Payer: Self-pay | Admitting: Physical Therapy

## 2022-04-05 ENCOUNTER — Ambulatory Visit: Payer: Medicare HMO | Admitting: Physical Therapy

## 2022-04-05 DIAGNOSIS — N393 Stress incontinence (female) (male): Secondary | ICD-10-CM

## 2022-04-05 DIAGNOSIS — M6281 Muscle weakness (generalized): Secondary | ICD-10-CM | POA: Diagnosis not present

## 2022-04-05 DIAGNOSIS — R278 Other lack of coordination: Secondary | ICD-10-CM | POA: Diagnosis not present

## 2022-04-05 NOTE — Therapy (Signed)
OUTPATIENT PHYSICAL THERAPY TREATMENT NOTE   Patient Name: Amanda Moss MRN: [id number] DOB:[birth year], 66 y.o., female Today's Date: 04/05/2022  PCP: Farrel Conners, MD REFERRING PROVIDER: Farrel Conners, MD  END OF SESSION:   PT End of Session - 04/05/22 0803     Visit Number 3    Date for PT Re-Evaluation 06/05/22    Authorization Type aetna medicare    Authorization - Visit Number 3    Authorization - Number of Visits 10    PT Start Time 0800    PT Stop Time 0840    PT Time Calculation (min) 40 min    Activity Tolerance Patient tolerated treatment well    Behavior During Therapy [facility] for tasks assessed/performed             Past Medical History:  Diagnosis Date   Bleeding 2003   hemorrhoidal bleeding   Hypertension    Squamous cell cancer of skin of nose    per patient treated by Dr Trixie Deis PAIN, RIGHT 06/20/2010   Qualifier: Diagnosis of  By: Elizebeth Koller MD, Mora Appl     Tobacco use    Tobacco use disorder 09/21/2014   Vitamin D deficiency    Past Surgical History:  Procedure Laterality Date   CESAREAN SECTION     COSMETIC SURGERY  2001   eyes   NASAL RECONSTRUCTION     RHINOPLASTY     SQUAMOUS CELL CARCINOMA EXCISION     on nose; require reconstruction   Patient Active Problem List   Diagnosis Date Noted   Vitamin D deficiency 02/27/2016   Osteopenia 02/27/2016   Essential hypertension 09/21/2014   REFERRING DIAG: N39.3 (ICD-10-CM) - Stress incontinence   THERAPY DIAG:  Muscle weakness (generalized)   Other lack of coordination   Stress incontinence   Rationale for Evaluation and Treatment Rehabilitation   ONSET DATE: 2022   SUBJECTIVE:                                                                                                                                                                                            SUBJECTIVE STATEMENT: The leakage is 80% better.    PAIN:  Are you having pain? No     PRECAUTIONS: Other:  skin cancer   WEIGHT BEARING RESTRICTIONS No   FALLS:  Has patient fallen in last 6 months? No   LIVING ENVIRONMENT: Lives with: lives alone   OCCUPATION: retire, freelance sitting; yoga   PLOF: Independent   PATIENT GOALS reduce leakage   PERTINENT HISTORY:  C-section; skin cancer   BOWEL MOVEMENT Pain with bowel  movement: No Type of bowel movement:Type (Bristol Stool Scale) initially Type 1 then 6 or 7, Frequency daily, Strain No, and Splinting no Fully empty rectum: No Leakage: No Fiber supplement: No   URINATION Pain with urination: No Fully empty bladder: Yes:   Stream:  average Urgency: Yes:   Frequency: 2-3 hours; no leakage at night Leakage: urge to void, walking to the bathroom, key in door, getting into the store Pads: poise light 1 per day   INTERCOURSE; no sexually active   PREGNANCY C-section deliveries 1   PROLAPSE None       OBJECTIVE:    DIAGNOSTIC FINDINGS:  none     COGNITION:            Overall cognitive status: Within functional limits for tasks assessed                          SENSATION:            Light touch: Appears intact            Proprioception: Appears intact                  POSTURE: decreased lumbar lordosis               PELVIC ALIGNMENT:equal   LUMBARAROM/PROM   A/PROM A/PROM  eval  Flexion Decreased by 25%  Extension Decreased by 50%  Right lateral flexion Decreased by 25%  Left lateral flexion Decreased by 25%  Right rotation Decreased by 25%  Left rotation Decreased by 25%   (Blank rows = not tested)   LOWER EXTREMITY GNO:[id number] hip ROM is full       LOWER EXTREMITY MMT:   MMT Right eval Left eval  Hip extension 5/5 4/5   Hip abduction 4/5 4-/5  Hip adduction 4/5 4/5     PALPATION:   General  contracts the upper abdominals not the lower; restrictions on the c-section scar, reductio of left gluteal tightening                 External Perineal Exam intact                              Internal Pelvic Floor tightness on the sides of the introitus and perineal body   Patient confirms identification and approves PT to assess internal pelvic floor and treatment Yes   PELVIC MMT:   MMT eval  Vaginal 2/5 anterior and posterior, 1/5 laterally, holding 2 sec  (Blank rows = not tested)         TONE: average   PROLAPSE: none   TODAY'S TREATMENT  04/05/2022 Manual: Scar tissue mobilization; to c-section scar to improve mobility and educated patient on how to perform at home.  Neuromuscular re-education: Pelvic floor contraction training:transverse abdominus contraction 10x Supine marching with pelvic floor and core contraction 10x each leg, needed tactile cues to contract the abdominals correctly Self-care: Educated patient on the urge to void and had her practice    03/29/2022 Manual: Scar tissue mobilization:manual work to the c-section scar with going through the restrictions and lifting the tissue Myofascial release:fascial release along the left lower abdominal area Neuromuscular re-education: Pelvic floor contraction training:hookly contract the lower abdominal with pelvic floor while squeezing the ball 15 times and tactile cues to the lower abdomen Hookly with isometric hip flexion to engage the lower abdomen holding 5 sec 5  times each side.  Self-care: Discussed with patient on not drinking irritants 2 hours prior to bedtime.  Educated patient further on what bladder irritants are and how they affect the pelvic floor.      EVAL Date: 03/13/2022 HEP established-see below     PATIENT EDUCATION: 03/29/2022 Education details: Access Code: [email or web address], urge to void and scar massage Person educated: Patient Education method: Explanation, Demonstration, Tactile cues, Verbal cues, and Handouts Education comprehension: verbalized understanding, returned demonstration, verbal cues required, tactile cues required, and needs further education      HOME EXERCISE  PROGRAM: 04/05/2022 Access Code: [email or web address] URL: [email or web address] Date: 04/05/2022 Prepared by: Earlie Counts  Exercises - Hooklying Transversus Abdominis Palpation  - 1 x daily - 7 x weekly - 1 sets - 10 reps - Hooklying Single Leg March  - 1 x daily - 7 x weekly - 2 sets - 10 reps     ASSESSMENT:   CLINICAL IMPRESSION: Patient is a 66 y.o. female who was seen today for physical therapy  treatment for stress incontinence. Patient reports her urinary leakage is 80% better. She has learned about scar massage and after manual work there was improvement of the tissue mobility. Patient is able to engage her lower abdominals correctly.  Patient will benefit from skilled therapy to work on pelvic floor coordination and strength to reduce urinary leakage.      OBJECTIVE IMPAIRMENTS decreased activity tolerance, decreased coordination, decreased endurance, decreased strength, and increased fascial restrictions.    ACTIVITY LIMITATIONS continence   PARTICIPATION LIMITATIONS: community activity   PERSONAL FACTORS Fitness and 1 comorbidity: c-section  are also affecting patient's functional outcome.    REHAB POTENTIAL: Excellent   CLINICAL DECISION MAKING: Stable/uncomplicated   EVALUATION COMPLEXITY: Low     GOALS: Goals reviewed with patient? Yes   SHORT TERM GOALS: Target date: 04/10/2022   Patient educated on bladder irritants and how they effect the bladder.  Baseline: Goal status: Met 03/29/2022   2.  Patient educated on perineal massage to reduce muscle tension.  Baseline:  Goal status: Met 03/29/2022   3.  Patient able to contract the upper and lower abdomen together to incorporate the pelvic floor muscles.  Baseline:  Goal status: Met 04/05/2022   4.  Patient educated on the urge to void to reduce the urgency to get to the bathroom.  Baseline:  Goal status: met 04/05/2022   LONG TERM GOALS: Target date: 06/05/2022    Patient independent with  advanced HEP for core and pelvic floor strength . Baseline:  Goal status: INITIAL   2.  Pelvic floor strength >/= 3/5 with circular hug to reduce urinary leakage so she does not have to wear a pad.  Baseline:  Goal status: INITIAL   3.  Patient able to walk to the bathroom without urinary leakage due to increased control of the pelvic floor and good circular contraction.  Baseline:  Goal status: INITIAL   4.  Patient urgency reduced >/= 90% so she is able to walk into her home from being out and walk slowly to the bathroom.  Baseline:  Goal status: INITIAL     PLAN: PT FREQUENCY: 1x/week   PT DURATION: 12 weeks   PLANNED INTERVENTIONS: Therapeutic exercises, Therapeutic activity, Neuromuscular re-education, Patient/Family education, Self Care, Joint mobilization, Dry Needling, Spinal mobilization, scar mobilization, Biofeedback, and Manual therapy   PLAN FOR NEXT SESSION: manual work to the sides of the introitus and perineal body, scar massage to c-section scar  progress abdominal  exercise   Earlie Counts, PT 04/05/22 8:43 AM

## 2022-04-05 NOTE — Patient Instructions (Signed)
Urge Incontinence  Ideal urination frequency is every 2-4 wakeful hours, which equates to 5-8 times within a 24-hour period.   Urge incontinence is leakage that occurs when the bladder muscle contracts, creating a sudden need to go before getting to the bathroom.   Going too often when your bladder isn't actually full can disrupt the body's automatic signals to store and hold urine longer, which will increase urgency/frequency.  In this case, the bladder "is running the show" and strategies can be learned to retrain this pattern.   One should be able to control the first urge to urinate, at around 1110m.  The bladder can hold up to a "grande latte," or 4065m To help you gain control, practice the Urge Drill below when urgency strikes.  This drill will help retrain your bladder signals and allow you to store and hold urine longer.  The overall goal is to stretch out your time between voids to reach a more manageable voiding schedule.    Practice your "quick flicks" often throughout the day (each waking hour) even when you don't need feel the urge to go.  This will help strengthen your pelvic floor muscles, making them more effective in controlling leakage.  Urge Drill  When you feel an urge to go, follow these steps to regain control: Stop what you are doing and be still Take one deep breath, directing your air into your abdomen Think an affirming thought, such as "I've got this." Do 5 quick flicks of your pelvic floor Heel raises 5 times letting the heels come down hard Walk with control to the bathroom to void, or delay voiding If get the urge stop and repeat above.  Amanda Moss [address] [id number] # [phone]

## 2022-04-08 ENCOUNTER — Encounter: Payer: Medicare HMO | Admitting: Physical Therapy

## 2022-04-10 ENCOUNTER — Ambulatory Visit: Payer: Medicare HMO | Admitting: Physical Therapy

## 2022-04-10 ENCOUNTER — Encounter: Payer: Self-pay | Admitting: Physical Therapy

## 2022-04-10 DIAGNOSIS — M6281 Muscle weakness (generalized): Secondary | ICD-10-CM | POA: Diagnosis not present

## 2022-04-10 DIAGNOSIS — R278 Other lack of coordination: Secondary | ICD-10-CM | POA: Diagnosis not present

## 2022-04-10 DIAGNOSIS — N393 Stress incontinence (female) (male): Secondary | ICD-10-CM

## 2022-04-10 NOTE — Therapy (Signed)
OUTPATIENT PHYSICAL THERAPY TREATMENT NOTE   Patient Name: Amanda Moss MRN: [id number] DOB:1956/05/23, 66 y.o., female Today's Date: 04/10/2022  PCP: Farrel Conners, MD REFERRING PROVIDER: Farrel Conners, MD  END OF SESSION:   PT End of Session - 04/10/22 0844     Visit Number 4    Date for PT Re-Evaluation 06/05/22    Authorization Type aetna medicare    Authorization - Visit Number 4    Authorization - Number of Visits 10    PT Start Time 0845    PT Stop Time 0925    PT Time Calculation (min) 40 min    Activity Tolerance Patient tolerated treatment well    Behavior During Therapy [facility] for tasks assessed/performed             Past Medical History:  Diagnosis Date   Bleeding 2003   hemorrhoidal bleeding   Hypertension    Squamous cell cancer of skin of nose    per patient treated by Dr Trixie Deis PAIN, RIGHT 06/20/2010   Qualifier: Diagnosis of  By: Elizebeth Koller MD, Mora Appl     Tobacco use    Tobacco use disorder 09/21/2014   Vitamin D deficiency    Past Surgical History:  Procedure Laterality Date   CESAREAN SECTION     COSMETIC SURGERY  2001   eyes   NASAL RECONSTRUCTION     RHINOPLASTY     SQUAMOUS CELL CARCINOMA EXCISION     on nose; require reconstruction   Patient Active Problem List   Diagnosis Date Noted   Vitamin D deficiency 02/27/2016   Osteopenia 02/27/2016   Essential hypertension 09/21/2014   REFERRING DIAG: N39.3 (ICD-10-CM) - Stress incontinence   THERAPY DIAG:  Muscle weakness (generalized)   Other lack of coordination   Stress incontinence   Rationale for Evaluation and Treatment Rehabilitation   ONSET DATE: 2022   SUBJECTIVE:                                                                                                                                                                                            SUBJECTIVE STATEMENT: I have used my urge to void technique and it works.     PAIN:  Are you having pain?  No     PRECAUTIONS: Other: skin cancer   WEIGHT BEARING RESTRICTIONS No   FALLS:  Has patient fallen in last 6 months? No   LIVING ENVIRONMENT: Lives with: lives alone   OCCUPATION: retire, freelance sitting; yoga   PLOF: Independent   PATIENT GOALS reduce leakage   PERTINENT HISTORY:  C-section; skin cancer  BOWEL MOVEMENT Pain with bowel movement: No Type of bowel movement:Type (Bristol Stool Scale) initially Type 1 then 6 or 7, Frequency daily, Strain No, and Splinting no Fully empty rectum: No Leakage: No Fiber supplement: No   URINATION Pain with urination: No Fully empty bladder: Yes:   Stream:  average Urgency: Yes:   Frequency: 2-3 hours; no leakage at night Leakage: urge to void, walking to the bathroom, key in door, getting into the store Pads: poise light 1 per day   INTERCOURSE; no sexually active   PREGNANCY C-section deliveries 1   PROLAPSE None       OBJECTIVE:    DIAGNOSTIC FINDINGS:  none     COGNITION:            Overall cognitive status: Within functional limits for tasks assessed                          SENSATION:            Light touch: Appears intact            Proprioception: Appears intact                  POSTURE: decreased lumbar lordosis               PELVIC ALIGNMENT:equal   LUMBARAROM/PROM   A/PROM A/PROM  eval  Flexion Decreased by 25%  Extension Decreased by 50%  Right lateral flexion Decreased by 25%  Left lateral flexion Decreased by 25%  Right rotation Decreased by 25%  Left rotation Decreased by 25%   (Blank rows = not tested)   LOWER EXTREMITY GYB:WLSLHTDSK hip ROM is full       LOWER EXTREMITY MMT:   MMT Right eval Left eval  Hip extension 5/5 4/5   Hip abduction 4/5 4-/5  Hip adduction 4/5 4/5     PALPATION:   General  contracts the upper abdominals not the lower; restrictions on the c-section scar, reductio of left gluteal tightening                 External Perineal Exam intact                              Internal Pelvic Floor tightness on the sides of the introitus and perineal body   Patient confirms identification and approves PT to assess internal pelvic floor and treatment Yes   PELVIC MMT:   MMT eval 10/25/023  Vaginal 2/5 anterior and posterior, 1/5 laterally, holding 2 sec 3/5 with good circular hug holding for 5 sec but will contract gluteals  (Blank rows = not tested)         TONE: average   PROLAPSE: none   TODAY'S TREATMENT  04/10/2022 Manual: Scar tissue mobilization:to c-section scar going through the restrictions above and below  and on the scar; used a suction cup to reduce the adherence to the tissue below the scar Internal pelvic floor techniques:No emotional/communication barriers or cognitive limitation. Patient is motivated to learn. Patient understands and agrees with treatment goals and plan. PT explains patient will be examined in standing, sitting, and lying down to see how their muscles and joints work. When they are ready, they will be asked to remove their underwear so PT can examine their perineum. The patient is also given the option of providing their own chaperone as one is not provided in our  facility. The patient also has the right and is explained the right to defer or refuse any part of the evaluation or treatment including the internal exam. With the patient's consent, PT will use one gloved finger to gently assess the muscles of the pelvic floor, seeing how well it contracts and relaxes and if there is muscle symmetry. After, the patient will get dressed and PT and patient will discuss exam findings and plan of care. PT and patient discuss plan of care, schedule, attendance policy and HEP activities.  Manual work to the introitus, bulbocavernosus, perineal body, urogenital diaphragm to lengthen the tissue Neuromuscular re-education: Pelvic floor contraction training: therapist finger in the introitus working on contraction and  tapping on the sides to improve circular contraction Holding pelvic floor contraction for 5 seconds with verbal cues to not contract her gluteals.     04/05/2022 Manual: Scar tissue mobilization; to c-section scar to improve mobility and educated patient on how to perform at home.  Neuromuscular re-education: Pelvic floor contraction training:transverse abdominus contraction 10x Supine marching with pelvic floor and core contraction 10x each leg, needed tactile cues to contract the abdominals correctly Self-care: Educated patient on the urge to void and had her practice    03/29/2022 Manual: Scar tissue mobilization:manual work to the c-section scar with going through the restrictions and lifting the tissue Myofascial release:fascial release along the left lower abdominal area Neuromuscular re-education: Pelvic floor contraction training:hookly contract the lower abdominal with pelvic floor while squeezing the ball 15 times and tactile cues to the lower abdomen Hookly with isometric hip flexion to engage the lower abdomen holding 5 sec 5 times each side.  Self-care: Discussed with patient on not drinking irritants 2 hours prior to bedtime.  Educated patient further on what bladder irritants are and how they affect the pelvic floor.      EVAL Date: 03/13/2022 HEP established-see below     PATIENT EDUCATION: 03/29/2022 Education details: Access Code: [email or web address], urge to void and scar massage Person educated: Patient Education method: Explanation, Demonstration, Tactile cues, Verbal cues, and Handouts Education comprehension: verbalized understanding, returned demonstration, verbal cues required, tactile cues required, and needs further education      HOME EXERCISE PROGRAM: 04/05/2022 Access Code: [email or web address] URL: [email or web address] Date: 04/05/2022 Prepared by: Earlie Counts   Exercises - Hooklying Transversus Abdominis Palpation  - 1 x daily - 7 x weekly - 1 sets -  10 reps - Hooklying Single Leg March  - 1 x daily - 7 x weekly - 2 sets - 10 reps       ASSESSMENT:   CLINICAL IMPRESSION: Patient is a 66 y.o. female who was seen today for physical therapy  treatment for stress incontinence. Patient reports her urinary leakage is 85% better. She has improved mobility of the c-section scar. Pelvic floor strength increased to 3/5 holding for 5 seconds and good hug of therapist finger. Patient needs verbal cues to not contact her gluteals with pelvic floor contraction.  Patient will benefit from skilled therapy to work on pelvic floor coordination and strength to reduce urinary leakage.      OBJECTIVE IMPAIRMENTS decreased activity tolerance, decreased coordination, decreased endurance, decreased strength, and increased fascial restrictions.    ACTIVITY LIMITATIONS continence   PARTICIPATION LIMITATIONS: community activity   PERSONAL FACTORS Fitness and 1 comorbidity: c-section  are also affecting patient's functional outcome.    REHAB POTENTIAL: Excellent   CLINICAL DECISION MAKING: Stable/uncomplicated   EVALUATION COMPLEXITY: Low     GOALS: Goals reviewed  with patient? Yes   SHORT TERM GOALS: Target date: 04/10/2022   Patient educated on bladder irritants and how they effect the bladder.  Baseline: Goal status: Met 03/29/2022   2.  Patient educated on perineal massage to reduce muscle tension.  Baseline:  Goal status: Met 03/29/2022   3.  Patient able to contract the upper and lower abdomen together to incorporate the pelvic floor muscles.  Baseline:  Goal status: Met 04/05/2022   4.  Patient educated on the urge to void to reduce the urgency to get to the bathroom.  Baseline:  Goal status: met 04/05/2022   LONG TERM GOALS: Target date: 06/05/2022    Patient independent with advanced HEP for core and pelvic floor strength . Baseline:  Goal status: INITIAL   2.  Pelvic floor strength >/= 3/5 with circular hug to reduce urinary  leakage so she does not have to wear a pad.  Baseline:  Goal status: Met 04/10/2022   3.  Patient able to walk to the bathroom without urinary leakage due to increased control of the pelvic floor and good circular contraction.  Baseline:  Goal status: INITIAL   4.  Patient urgency reduced >/= 90% so she is able to walk into her home from being out and walk slowly to the bathroom.  Baseline:  Goal status: INITIAL     PLAN: PT FREQUENCY: 1x/week   PT DURATION: 12 weeks   PLANNED INTERVENTIONS: Therapeutic exercises, Therapeutic activity, Neuromuscular re-education, Patient/Family education, Self Care, Joint mobilization, Dry Needling, Spinal mobilization, scar mobilization, Biofeedback, and Manual therapy   PLAN FOR NEXT SESSION: manual work to the sides of the introitus and perineal body, scar massage to c-section scar  progress abdominal exercise     Earlie Counts, PT 04/10/22 9:27 AM

## 2022-04-17 ENCOUNTER — Other Ambulatory Visit: Payer: Self-pay | Admitting: *Deleted

## 2022-04-17 MED ORDER — AMLODIPINE BESYLATE 5 MG PO TABS: ORAL_TABLET | ORAL | 1 refills | Status: DC

## 2022-04-19 ENCOUNTER — Encounter: Payer: Medicare HMO | Admitting: Physical Therapy

## 2022-04-26 ENCOUNTER — Ambulatory Visit: Payer: Medicare HMO | Attending: Family Medicine | Admitting: Physical Therapy

## 2022-04-26 ENCOUNTER — Encounter: Payer: Self-pay | Admitting: Physical Therapy

## 2022-04-26 DIAGNOSIS — M6281 Muscle weakness (generalized): Secondary | ICD-10-CM | POA: Insufficient documentation

## 2022-04-26 DIAGNOSIS — R278 Other lack of coordination: Secondary | ICD-10-CM | POA: Insufficient documentation

## 2022-04-26 DIAGNOSIS — N393 Stress incontinence (female) (male): Secondary | ICD-10-CM | POA: Insufficient documentation

## 2022-04-26 NOTE — Therapy (Addendum)
OUTPATIENT PHYSICAL THERAPY TREATMENT NOTE   Patient Name: Amanda Moss MRN: [id number] DOB:1956-03-28, 66 y.o., female Today's Date: 04/26/2022  PCP: Farrel Conners, MD  REFERRING PROVIDER: Farrel Conners, MD   END OF SESSION:   PT End of Session - 04/26/22 0932     Visit Number 5    Date for PT Re-Evaluation 06/05/22    Authorization Type aetna medicare    Authorization - Visit Number 5    Authorization - Number of Visits 5    PT Start Time 0930    PT Stop Time 8144    PT Time Calculation (min) 38 min    Activity Tolerance Patient tolerated treatment well    Behavior During Therapy [facility] for tasks assessed/performed             Past Medical History:  Diagnosis Date   Bleeding 2003   hemorrhoidal bleeding   Hypertension    Squamous cell cancer of skin of nose    per patient treated by Dr Trixie Deis PAIN, RIGHT 06/20/2010   Qualifier: Diagnosis of  By: Elizebeth Koller MD, Mora Appl     Tobacco use    Tobacco use disorder 09/21/2014   Vitamin D deficiency    Past Surgical History:  Procedure Laterality Date   CESAREAN SECTION     COSMETIC SURGERY  2001   eyes   NASAL RECONSTRUCTION     RHINOPLASTY     SQUAMOUS CELL CARCINOMA EXCISION     on nose; require reconstruction   Patient Active Problem List   Diagnosis Date Noted   Vitamin D deficiency 02/27/2016   Osteopenia 02/27/2016   Essential hypertension 09/21/2014   REFERRING DIAG: N39.3 (ICD-10-CM) - Stress incontinence   THERAPY DIAG:  Muscle weakness (generalized)   Other lack of coordination   Stress incontinence   Rationale for Evaluation and Treatment Rehabilitation   ONSET DATE: 2022   SUBJECTIVE:                                                                                                                                                                                            SUBJECTIVE STATEMENT: I am doing well.    PAIN:  Are you having pain? No     PRECAUTIONS: Other: skin  cancer   WEIGHT BEARING RESTRICTIONS No   FALLS:  Has patient fallen in last 6 months? No   LIVING ENVIRONMENT: Lives with: lives alone   OCCUPATION: retire, freelance sitting; yoga   PLOF: Independent   PATIENT GOALS reduce leakage   PERTINENT HISTORY:  C-section; skin cancer   BOWEL MOVEMENT Pain with  bowel movement: No Type of bowel movement:Type (Bristol Stool Scale) initially Type 1 then 6 or 7, Frequency daily, Strain No, and Splinting no Fully empty rectum: No Leakage: No Fiber supplement: No   URINATION Pain with urination: No Fully empty bladder: Yes:   Stream:  average Urgency: Yes:   Frequency: 2-3 hours; no leakage at night Leakage: 1 time when walking to the bathroom due not using the urge to void method.  Pads: light pad for just incase when going out   INTERCOURSE; no sexually active   PREGNANCY C-section deliveries 1   PROLAPSE None        OBJECTIVE: (objective measures completed at initial evaluation unless otherwise dated)   (Copy Eval's Objective through Plan section here) DIAGNOSTIC FINDINGS:  none     COGNITION:            Overall cognitive status: Within functional limits for tasks assessed                          SENSATION:            Light touch: Appears intact            Proprioception: Appears intact                  POSTURE: decreased lumbar lordosis               PELVIC ALIGNMENT:equal   LUMBARAROM/PROM   A/PROM A/PROM  eval  Flexion Decreased by 25%  Extension Decreased by 50%  Right lateral flexion Decreased by 25%  Left lateral flexion Decreased by 25%  Right rotation Decreased by 25%  Left rotation Decreased by 25%   (Blank rows = not tested)   LOWER EXTREMITY MOL:[id number] hip ROM is full       LOWER EXTREMITY MMT:   MMT Right eval Left eval Right  04/26/2022 Left 04/26/2022  Hip extension 5/5 4/5  5/5 4+/5  Hip abduction 4/5 4-/5 4+/5 4+/5  Hip adduction 4/5 4/5 5/5 5/5     PALPATION:    General  contracts the upper abdominals not the lower; restrictions on the c-section scar, reductio of left gluteal tightening                 External Perineal Exam intact                             Internal Pelvic Floor tightness on the sides of the introitus and perineal body   Patient confirms identification and approves PT to assess internal pelvic floor and treatment Yes   PELVIC MMT:   MMT eval 10/25/023  Vaginal 2/5 anterior and posterior, 1/5 laterally, holding 2 sec 3/5 with good circular hug holding for 5 sec but will contract gluteals  (Blank rows = not tested)         TONE: average   PROLAPSE: none   TODAY'S TREATMENT  04/26/2022 Manual: Scar tissue mobilization to suprapubic scar to improve mobility Myofascial release: fascial release suprapubic especially on the left  Exercises: Strengthening one legged bridge 2x5 each leg Supine march with green band around the knees and pressing a ball between hands Quadruped lift alternate extremity 10x each    04/10/2022 Manual: Scar tissue mobilization:to c-section scar going through the restrictions above and below  and on the scar; used a suction cup to reduce the adherence to the tissue below the scar  Internal pelvic floor techniques:No emotional/communication barriers or cognitive limitation. Patient is motivated to learn. Patient understands and agrees with treatment goals and plan. PT explains patient will be examined in standing, sitting, and lying down to see how their muscles and joints work. When they are ready, they will be asked to remove their underwear so PT can examine their perineum. The patient is also given the option of providing their own chaperone as one is not provided in our facility. The patient also has the right and is explained the right to defer or refuse any part of the evaluation or treatment including the internal exam. With the patient's consent, PT will use one gloved finger to gently assess the  muscles of the pelvic floor, seeing how well it contracts and relaxes and if there is muscle symmetry. After, the patient will get dressed and PT and patient will discuss exam findings and plan of care. PT and patient discuss plan of care, schedule, attendance policy and HEP activities.  Manual work to the introitus, bulbocavernosus, perineal body, urogenital diaphragm to lengthen the tissue Neuromuscular re-education: Pelvic floor contraction training: therapist finger in the introitus working on contraction and tapping on the sides to improve circular contraction Holding pelvic floor contraction for 5 seconds with verbal cues to not contract her gluteals.     04/05/2022 Manual: Scar tissue mobilization; to c-section scar to improve mobility and educated patient on how to perform at home.  Neuromuscular re-education: Pelvic floor contraction training:transverse abdominus contraction 10x Supine marching with pelvic floor and core contraction 10x each leg, needed tactile cues to contract the abdominals correctly Self-care: Educated patient on the urge to void and had her practice       PATIENT EDUCATION: 04/26/2022/2023 Education details: Access Code: [email or web address] Person educated: Patient Education method: Explanation, Demonstration, Tactile cues, Verbal cues, and Handouts Education comprehension: verbalized understanding, returned demonstration, verbal cues required, tactile cues required, and needs further education      HOME EXERCISE PROGRAM: 04/26/2022 Access Code: [email or web address] URL: [email or web address] Date: 04/26/2022 Prepared by: Earlie Counts  Exercises - Hooklying Single Leg March  - 1 x daily - 3 x weekly - 2 sets - 10 reps - Single Leg Bridge  - 1 x daily - 3 x weekly - 2 sets - 5 reps - Quadruped Pelvic Floor Contraction with Opposite Arm and Leg Lift  - 1 x daily - 3 x weekly - 1 sets - 10 reps       ASSESSMENT:   CLINICAL IMPRESSION: Patient is a 66 y.o.  female who was seen today for physical therapy  treatment for stress incontinence. Patient has increased in hip strength.  Patient is able to walk the bathroom  without leakage when she uses the urge to void. Pelvic floor strength is 3/5.    OBJECTIVE IMPAIRMENTS decreased activity tolerance, decreased coordination, decreased endurance, decreased strength, and increased fascial restrictions.    ACTIVITY LIMITATIONS continence   PARTICIPATION LIMITATIONS: community activity   PERSONAL FACTORS Fitness and 1 comorbidity: c-section  are also affecting patient's functional outcome.    REHAB POTENTIAL: Excellent   CLINICAL DECISION MAKING: Stable/uncomplicated   EVALUATION COMPLEXITY: Low     GOALS: Goals reviewed with patient? Yes   SHORT TERM GOALS: Target date: 04/10/2022   Patient educated on bladder irritants and how they effect the bladder.  Baseline: Goal status: Met 03/29/2022   2.  Patient educated on perineal massage to reduce muscle tension.  Baseline:  Goal status: Met 03/29/2022  3.  Patient able to contract the upper and lower abdomen together to incorporate the pelvic floor muscles.  Baseline:  Goal status: Met 04/05/2022   4.  Patient educated on the urge to void to reduce the urgency to get to the bathroom.  Baseline:  Goal status: met 04/05/2022   LONG TERM GOALS: Target date: 06/05/2022    Patient independent with advanced HEP for core and pelvic floor strength . Baseline:  Goal status: Met 04/26/2022   2.  Pelvic floor strength >/= 3/5 with circular hug to reduce urinary leakage so she does not have to wear a pad.  Baseline:  Goal status: Met 04/10/2022   3.  Patient able to walk to the bathroom without urinary leakage due to increased control of the pelvic floor and good circular contraction.  Baseline:  Goal status: Met 04/26/2022   4.  Patient urgency reduced >/= 90% so she is able to walk into her home from being out and walk slowly to the  bathroom.  Baseline:  Goal status: Met 04/26/2022     PLAN: PLAN FOR NEXT SESSION: discharge to [facility], PT 04/26/22 10:15 AM

## 2022-05-03 ENCOUNTER — Encounter: Payer: Medicare HMO | Admitting: Physical Therapy

## 2022-05-13 DIAGNOSIS — R69 Illness, unspecified: Secondary | ICD-10-CM | POA: Diagnosis not present

## 2022-05-13 DIAGNOSIS — Z6825 Body mass index (BMI) 25.0-25.9, adult: Secondary | ICD-10-CM | POA: Diagnosis not present

## 2022-05-13 DIAGNOSIS — Z124 Encounter for screening for malignant neoplasm of cervix: Secondary | ICD-10-CM | POA: Diagnosis not present

## 2022-05-13 DIAGNOSIS — Z01419 Encounter for gynecological examination (general) (routine) without abnormal findings: Secondary | ICD-10-CM | POA: Diagnosis not present

## 2022-05-17 ENCOUNTER — Encounter: Payer: Medicare HMO | Admitting: Physical Therapy

## 2022-07-23 DIAGNOSIS — J3489 Other specified disorders of nose and nasal sinuses: Secondary | ICD-10-CM | POA: Diagnosis not present

## 2022-07-23 DIAGNOSIS — C300 Malignant neoplasm of nasal cavity: Secondary | ICD-10-CM | POA: Diagnosis not present

## 2022-09-17 ENCOUNTER — Ambulatory Visit (INDEPENDENT_AMBULATORY_CARE_PROVIDER_SITE_OTHER): Payer: Medicare HMO | Admitting: Family Medicine

## 2022-09-17 ENCOUNTER — Encounter: Payer: Self-pay | Admitting: Family Medicine

## 2022-09-17 VITALS — BP 132/78 | HR 80 | Temp 97.6°F | Ht 60.5 in | Wt 127.6 lb

## 2022-09-17 DIAGNOSIS — E559 Vitamin D deficiency, unspecified: Secondary | ICD-10-CM

## 2022-09-17 DIAGNOSIS — I10 Essential (primary) hypertension: Secondary | ICD-10-CM | POA: Diagnosis not present

## 2022-09-17 DIAGNOSIS — Z0000 Encounter for general adult medical examination without abnormal findings: Secondary | ICD-10-CM | POA: Diagnosis not present

## 2022-09-17 LAB — COMPREHENSIVE METABOLIC PANEL
ALT: 20 U/L (ref 0–35)
AST: 21 U/L (ref 0–37)
Albumin: 4.6 g/dL (ref 3.5–5.2)
Alkaline Phosphatase: 85 U/L (ref 39–117)
BUN: 18 mg/dL (ref 6–23)
CO2: 28 mEq/L (ref 19–32)
Calcium: 10.1 mg/dL (ref 8.4–10.5)
Chloride: 102 mEq/L (ref 96–112)
Creatinine, Ser: 0.75 mg/dL (ref 0.40–1.20)
GFR: 81.97 mL/min (ref >60.00)
Glucose, Bld: 106 mg/dL — ABNORMAL HIGH (ref 70–99)
Potassium: 4.3 mEq/L (ref 3.5–5.1)
Sodium: 138 mEq/L (ref 135–145)
Total Bilirubin: 0.6 mg/dL (ref 0.2–1.2)
Total Protein: 7.3 g/dL (ref 6.0–8.3)

## 2022-09-17 LAB — LIPID PANEL
Cholesterol: 203 mg/dL — ABNORMAL HIGH (ref 0–200)
HDL: 59.8 mg/dL (ref >39.00)
LDL Cholesterol: 113 mg/dL — ABNORMAL HIGH (ref 0–99)
NonHDL: 143.63
Total CHOL/HDL Ratio: 3
Triglycerides: 152 mg/dL — ABNORMAL HIGH (ref 0.0–149.0)
VLDL: 30.4 mg/dL (ref 0.0–40.0)

## 2022-09-17 LAB — VITAMIN D 25 HYDROXY (VIT D DEFICIENCY, FRACTURES): VITD: 34.8 ng/mL (ref 30.00–100.00)

## 2022-09-17 NOTE — Patient Instructions (Addendum)
Colonoscopy will be due in December Mammogram is due to September  Health Maintenance, Female Adopting a healthy lifestyle and getting preventive care are important in promoting health and wellness. Ask your health care provider about: The right schedule for you to have regular tests and exams. Things you can do on your own to prevent diseases and keep yourself healthy. What should I know about diet, weight, and exercise? Eat a healthy diet  Eat a diet that includes plenty of vegetables, fruits, low-fat dairy products, and lean protein. Do not eat a lot of foods that are high in solid fats, added sugars, or sodium. Maintain a healthy weight Body mass index (BMI) is used to identify weight problems. It estimates body fat based on height and weight. Your health care provider can help determine your BMI and help you achieve or maintain a healthy weight. Get regular exercise Get regular exercise. This is one of the most important things you can do for your health. Most adults should: Exercise for at least 150 minutes each week. The exercise should increase your heart rate and make you sweat (moderate-intensity exercise). Do strengthening exercises at least twice a week. This is in addition to the moderate-intensity exercise. Spend less time sitting. Even light physical activity can be beneficial. Watch cholesterol and blood lipids Have your blood tested for lipids and cholesterol at 68 years of age, then have this test every 5 years. Have your cholesterol levels checked more often if: Your lipid or cholesterol levels are high. You are older than 67 years of age. You are at high risk for heart disease. What should I know about cancer screening? Depending on your health history and family history, you may need to have cancer screening at various ages. This may include screening for: Breast cancer. Cervical cancer. Colorectal cancer. Skin cancer. Lung cancer. What should I know about heart  disease, diabetes, and high blood pressure? Blood pressure and heart disease High blood pressure causes heart disease and increases the risk of stroke. This is more likely to develop in people who have high blood pressure readings or are overweight. Have your blood pressure checked: Every 3-5 years if you are 58-50 years of age. Every year if you are 68 years old or older. Diabetes Have regular diabetes screenings. This checks your fasting blood sugar level. Have the screening done: Once every three years after age 68 if you are at a normal weight and have a low risk for diabetes. More often and at a younger age if you are overweight or have a high risk for diabetes. What should I know about preventing infection? Hepatitis B If you have a higher risk for hepatitis B, you should be screened for this virus. Talk with your health care provider to find out if you are at risk for hepatitis B infection. Hepatitis C Testing is recommended for: Everyone born from [birth year] through 1966. Anyone with known risk factors for hepatitis C. Sexually transmitted infections (STIs) Get screened for STIs, including gonorrhea and chlamydia, if: You are sexually active and are younger than 68 years of age. You are older than 68 years of age and your health care provider tells you that you are at risk for this type of infection. Your sexual activity has changed since you were last screened, and you are at increased risk for chlamydia or gonorrhea. Ask your health care provider if you are at risk. Ask your health care provider about whether you are at high risk for HIV. Your  health care provider may recommend a prescription medicine to help prevent HIV infection. If you choose to take medicine to prevent HIV, you should first get tested for HIV. You should then be tested every 3 months for as long as you are taking the medicine. Pregnancy If you are about to stop having your period (premenopausal) and you may become  pregnant, seek counseling before you get pregnant. Take 400 to 800 micrograms (mcg) of folic acid every day if you become pregnant. Ask for birth control (contraception) if you want to prevent pregnancy. Osteoporosis and menopause Osteoporosis is a disease in which the bones lose minerals and strength with aging. This can result in bone fractures. If you are 53 years old or older, or if you are at risk for osteoporosis and fractures, ask your health care provider if you should: Be screened for bone loss. Take a calcium or vitamin D supplement to lower your risk of fractures. Be given hormone replacement therapy (HRT) to treat symptoms of menopause. Follow these instructions at home: Alcohol use Do not drink alcohol if: Your health care provider tells you not to drink. You are pregnant, may be pregnant, or are planning to become pregnant. If you drink alcohol: Limit how much you have to: 0-1 drink a day. Know how much alcohol is in your drink. In the [place] one drink equals one 12 oz bottle of beer (355 mL), one 5 oz glass of wine (148 mL), or one 1 oz glass of hard liquor (44 mL). Lifestyle Do not use any products that contain nicotine or tobacco. These products include cigarettes, chewing tobacco, and vaping devices, such as e-cigarettes. If you need help quitting, ask your health care provider. Do not use street drugs. Do not share needles. Ask your health care provider for help if you need support or information about quitting drugs. General instructions Schedule regular health, dental, and eye exams. Stay current with your vaccines. Tell your health care provider if: You often feel depressed. You have ever been abused or do not feel safe at home. Summary Adopting a healthy lifestyle and getting preventive care are important in promoting health and wellness. Follow your health care provider's instructions about healthy diet, exercising, and getting tested or screened for  diseases. Follow your health care provider's instructions on monitoring your cholesterol and blood pressure. This information is not intended to replace advice given to you by your health care provider. Make sure you discuss any questions you have with your health care provider. Document Revised: 10/23/2020 Document Reviewed: 10/23/2020 Elsevier Patient Education  [place].

## 2022-09-17 NOTE — Progress Notes (Signed)
Complete physical exam  Patient: Amanda Moss   DOB: 1956/05/25   67 y.o. Female  MRN: [id number]  Subjective:    Chief Complaint  Patient presents with   Annual Exam    Amanda Moss is a 67 y.o. female who presents today for a complete physical exam. She reports consuming a general diet. The patient does not participate in regular exercise at present. Pt tries to get in her steps every day. She generally feels well. She reports sleeping well. She does not have additional problems to discuss today.    Most recent fall risk assessment:    03/06/2022   10:42 AM  Fall Risk   Falls in the past year? 0  Number falls in past yr: 0  Injury with Fall? 0  Risk for fall due to : No Fall Risks  Follow up Falls evaluation completed     Most recent depression screenings:    09/17/2022    8:00 AM 03/06/2022   10:40 AM  PHQ 2/9 Scores  PHQ - 2 Score 1 0  PHQ- 9 Score 1 3    Vision:Within last year and Dental: No current dental problems and Receives regular dental care  Patient Active Problem List   Diagnosis Date Noted   Vitamin D deficiency 02/27/2016   Osteopenia 02/27/2016   Essential hypertension 09/21/2014      Patient Care Team: Farrel Conners, MD as PCP - General (Family Medicine) Molli Posey, MD as Consulting Physician (Obstetrics and Gynecology)   Outpatient Medications Prior to Visit  Medication Sig   amLODipine (NORVASC) 5 MG tablet Take 1 and 1/2 tablets by mouth once daily   budesonide (PULMICORT) 0.5 MG/2ML nebulizer solution Mix one ampule (1 dose) to 23mL of saline in rinse bottle; irrigate sinuses with 120 mL through each nostril daily. QTY: 30 day supply   Cholecalciferol (VITAMIN D PO) Take by mouth.   clonazePAM (KLONOPIN) 0.5 MG tablet Take 1 tablet (0.5 mg total) by mouth daily as needed for anxiety.   Multiple Vitamins-Minerals (PRESERVISION AREDS PO) Take 1 tablet by mouth daily.    No facility-administered medications prior to visit.     Review of Systems  HENT:  Negative for hearing loss.   Eyes:  Negative for blurred vision.  Respiratory:  Negative for shortness of breath.   Cardiovascular:  Negative for chest pain.  Gastrointestinal: Negative.   Genitourinary: Negative.   Musculoskeletal:  Negative for back pain.  Neurological:  Negative for headaches.  Psychiatric/Behavioral:  Negative for depression.   All other systems reviewed and are negative.         Objective:     BP 132/78 (BP Location: Left Arm, Patient Position: Sitting, Cuff Size: Normal)   Pulse 80   Temp 97.6 F (36.4 C) (Oral)   Ht 5' 0.5" (1.537 m)   Wt 127 lb 9.6 oz (57.9 kg)   SpO2 97%   BMI 24.51 kg/m    Physical Exam Vitals reviewed.  Constitutional:      Appearance: Normal appearance. She is well-groomed and normal weight.  HENT:     Right Ear: Tympanic membrane normal.     Left Ear: Tympanic membrane normal.     Mouth/Throat:     Mouth: Mucous membranes are moist.     Pharynx: No posterior oropharyngeal erythema.  Eyes:     Conjunctiva/sclera: Conjunctivae normal.  Neck:     Thyroid: No thyromegaly.  Cardiovascular:     Rate and Rhythm: Normal  rate and regular rhythm.     Pulses: Normal pulses.     Heart sounds: S1 normal and S2 normal.  Pulmonary:     Effort: Pulmonary effort is normal.     Breath sounds: Normal breath sounds and air entry.  Abdominal:     General: Bowel sounds are normal.  Musculoskeletal:     Right lower leg: No edema.     Left lower leg: No edema.  Neurological:     Mental Status: She is alert and oriented to person, place, and time. Mental status is at baseline.     Gait: Gait is intact.  Psychiatric:        Mood and Affect: Mood and affect normal.        Speech: Speech normal.        Behavior: Behavior normal.        Judgment: Judgment normal.      No results found for any visits on 09/17/22.     Assessment & Plan:    Routine Health Maintenance and Physical  Exam  Immunization History  Administered Date(s) Administered   Influenza-Unspecified 03/17/2014, 03/18/2015, 04/17/2018   PFIZER(Purple Top)SARS-COV-2 Vaccination 07/31/2019, 08/25/2019, 03/17/2020   PNEUMOCOCCAL CONJUGATE-20 09/06/2020   Tdap 03/02/2014   Zoster Recombinat (Shingrix) 08/20/2021, 11/11/2021   Zoster, Live 09/21/2014    Health Maintenance  Topic Date Due   COVID-19 Vaccine (4 - 2023-24 season) 10/03/2022 (Originally 02/15/2022)   Lung Cancer Screening  12/21/2022   Medicare Annual Wellness (AWV)  12/25/2022   INFLUENZA VACCINE  01/16/2023   MAMMOGRAM  02/28/2023   COLONOSCOPY (Pts 45-61yrs Insurance coverage will need to be confirmed)  05/26/2023   DTaP/Tdap/Td (2 - Td or Tdap) 03/02/2024   Pneumonia Vaccine 68+ Years old  Completed   DEXA SCAN  Completed   Hepatitis C Screening  Completed   Zoster Vaccines- Shingrix  Completed   HPV VACCINES  Aged Out    Discussed health benefits of physical activity, and encouraged her to engage in regular exercise appropriate for her age and condition.  Problem List Items Addressed This Visit       Unprioritized   Essential hypertension - Primary   Relevant Orders   CMP   Lipid Panel   Vitamin D deficiency   Relevant Orders   Vitamin D, 25-hydroxy   Other Visit Diagnoses     Routine general medical examination at a health care facility          Return in about 1 year (around 09/17/2023) for annual physical exam.   Normal physical exam findings today. We discussed using supplements for bone health (pt states she will not go on any medication if she develops osteoporosis) and we also discussing increasing exercise to 30 minutes 5 days a week to encourage bone health. RTC in 1 year for annual physical. Mammo, LDCT and colonoscopy all due this year. Labs ordered for annual follow up.  Farrel Conners, MD

## 2022-10-01 DIAGNOSIS — H40033 Anatomical narrow angle, bilateral: Secondary | ICD-10-CM | POA: Diagnosis not present

## 2022-10-01 DIAGNOSIS — H25813 Combined forms of age-related cataract, bilateral: Secondary | ICD-10-CM | POA: Diagnosis not present

## 2022-10-01 DIAGNOSIS — H43811 Vitreous degeneration, right eye: Secondary | ICD-10-CM | POA: Diagnosis not present

## 2022-10-01 DIAGNOSIS — H35033 Hypertensive retinopathy, bilateral: Secondary | ICD-10-CM | POA: Diagnosis not present

## 2022-10-01 DIAGNOSIS — H353132 Nonexudative age-related macular degeneration, bilateral, intermediate dry stage: Secondary | ICD-10-CM | POA: Diagnosis not present

## 2022-10-14 ENCOUNTER — Other Ambulatory Visit: Payer: Self-pay | Admitting: Family Medicine

## 2022-10-23 DIAGNOSIS — H353131 Nonexudative age-related macular degeneration, bilateral, early dry stage: Secondary | ICD-10-CM | POA: Diagnosis not present

## 2022-10-23 DIAGNOSIS — H43813 Vitreous degeneration, bilateral: Secondary | ICD-10-CM | POA: Diagnosis not present

## 2022-10-23 DIAGNOSIS — H2513 Age-related nuclear cataract, bilateral: Secondary | ICD-10-CM | POA: Diagnosis not present

## 2022-11-28 ENCOUNTER — Other Ambulatory Visit: Payer: Self-pay | Admitting: Acute Care

## 2022-11-28 DIAGNOSIS — Z122 Encounter for screening for malignant neoplasm of respiratory organs: Secondary | ICD-10-CM

## 2022-11-28 DIAGNOSIS — Z87891 Personal history of nicotine dependence: Secondary | ICD-10-CM

## 2022-12-27 ENCOUNTER — Ambulatory Visit (INDEPENDENT_AMBULATORY_CARE_PROVIDER_SITE_OTHER): Payer: Medicare HMO

## 2022-12-27 VITALS — Ht 60.5 in | Wt 127.0 lb

## 2022-12-27 DIAGNOSIS — Z0000 Encounter for general adult medical examination without abnormal findings: Secondary | ICD-10-CM

## 2022-12-27 IMAGING — DX DG CHEST 2V
2 series · 2 of 2 positions shown · non-contrast
Comparison: None.

CLINICAL DATA: Shortness of breath on exertion

EXAM:
CHEST - 2 VIEW

[chest pa]
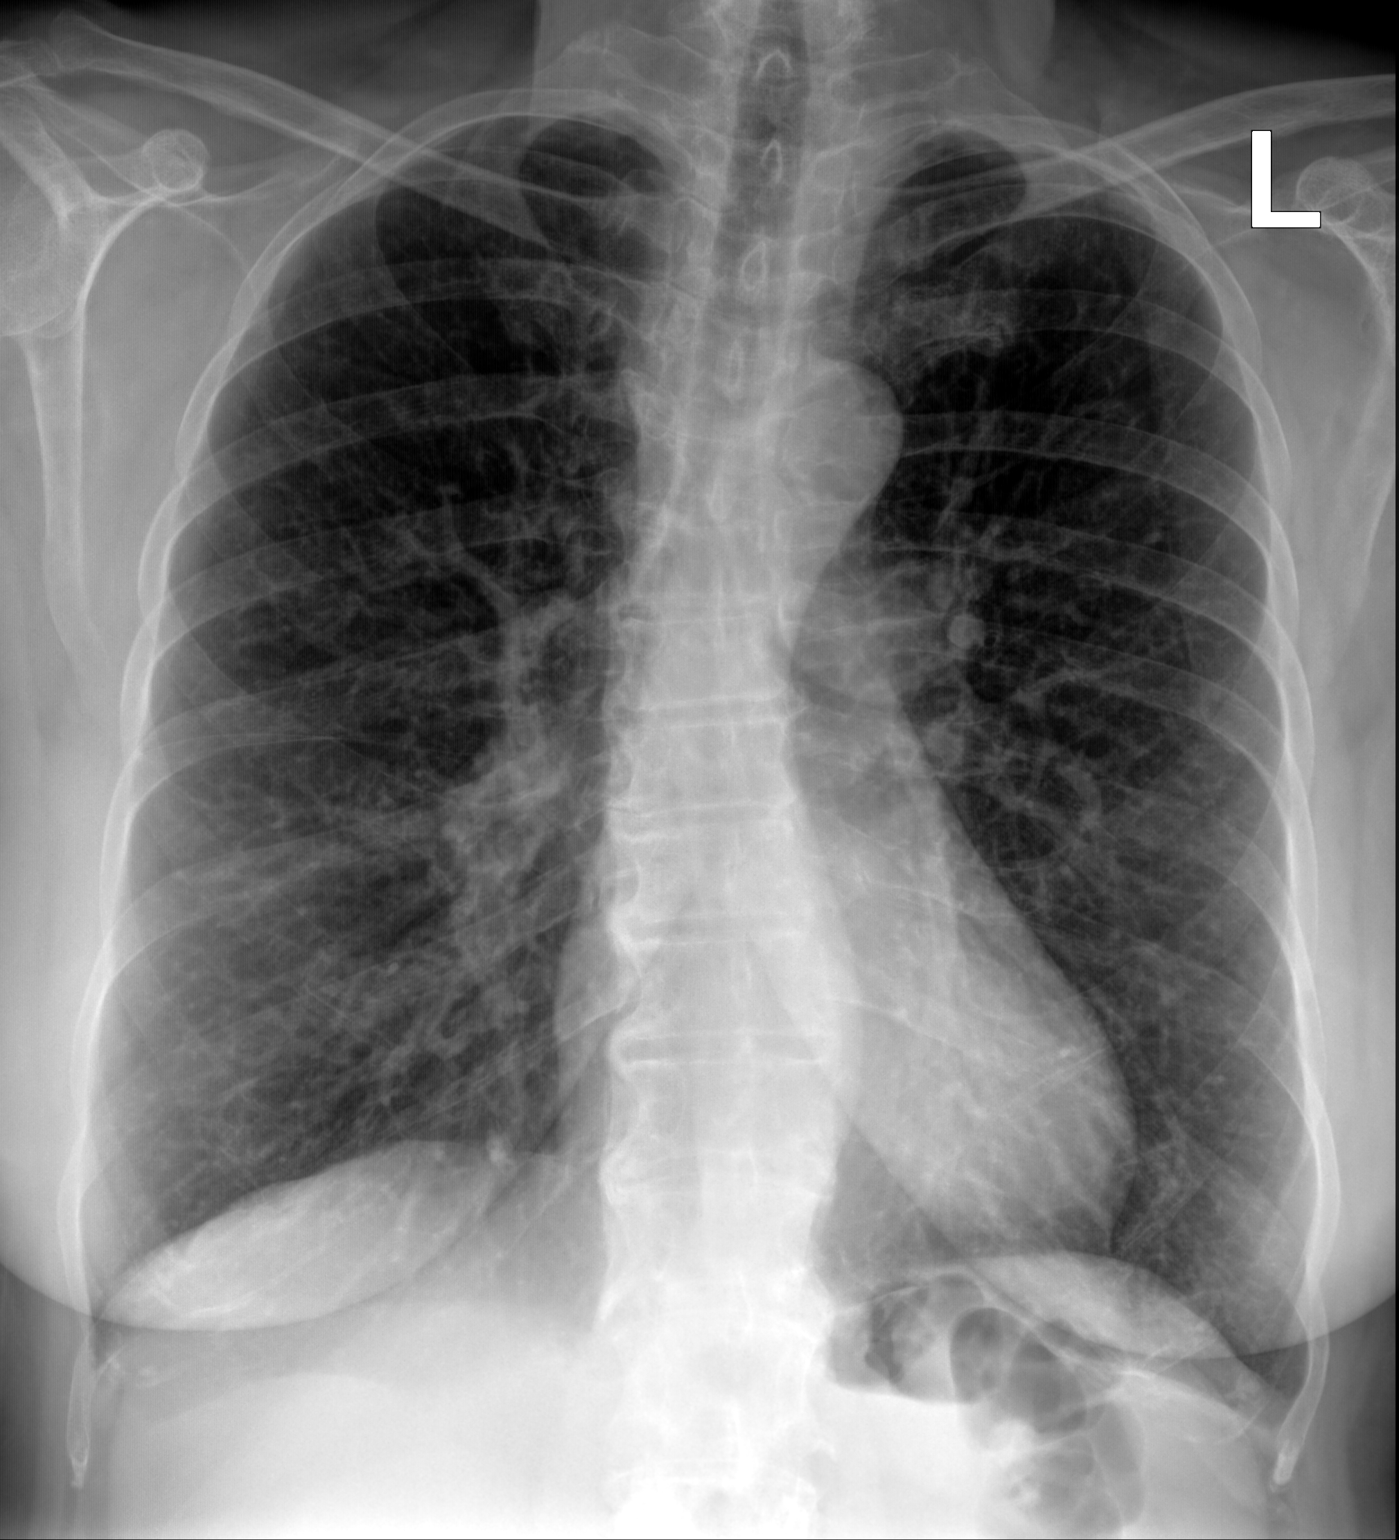

[chest lat]
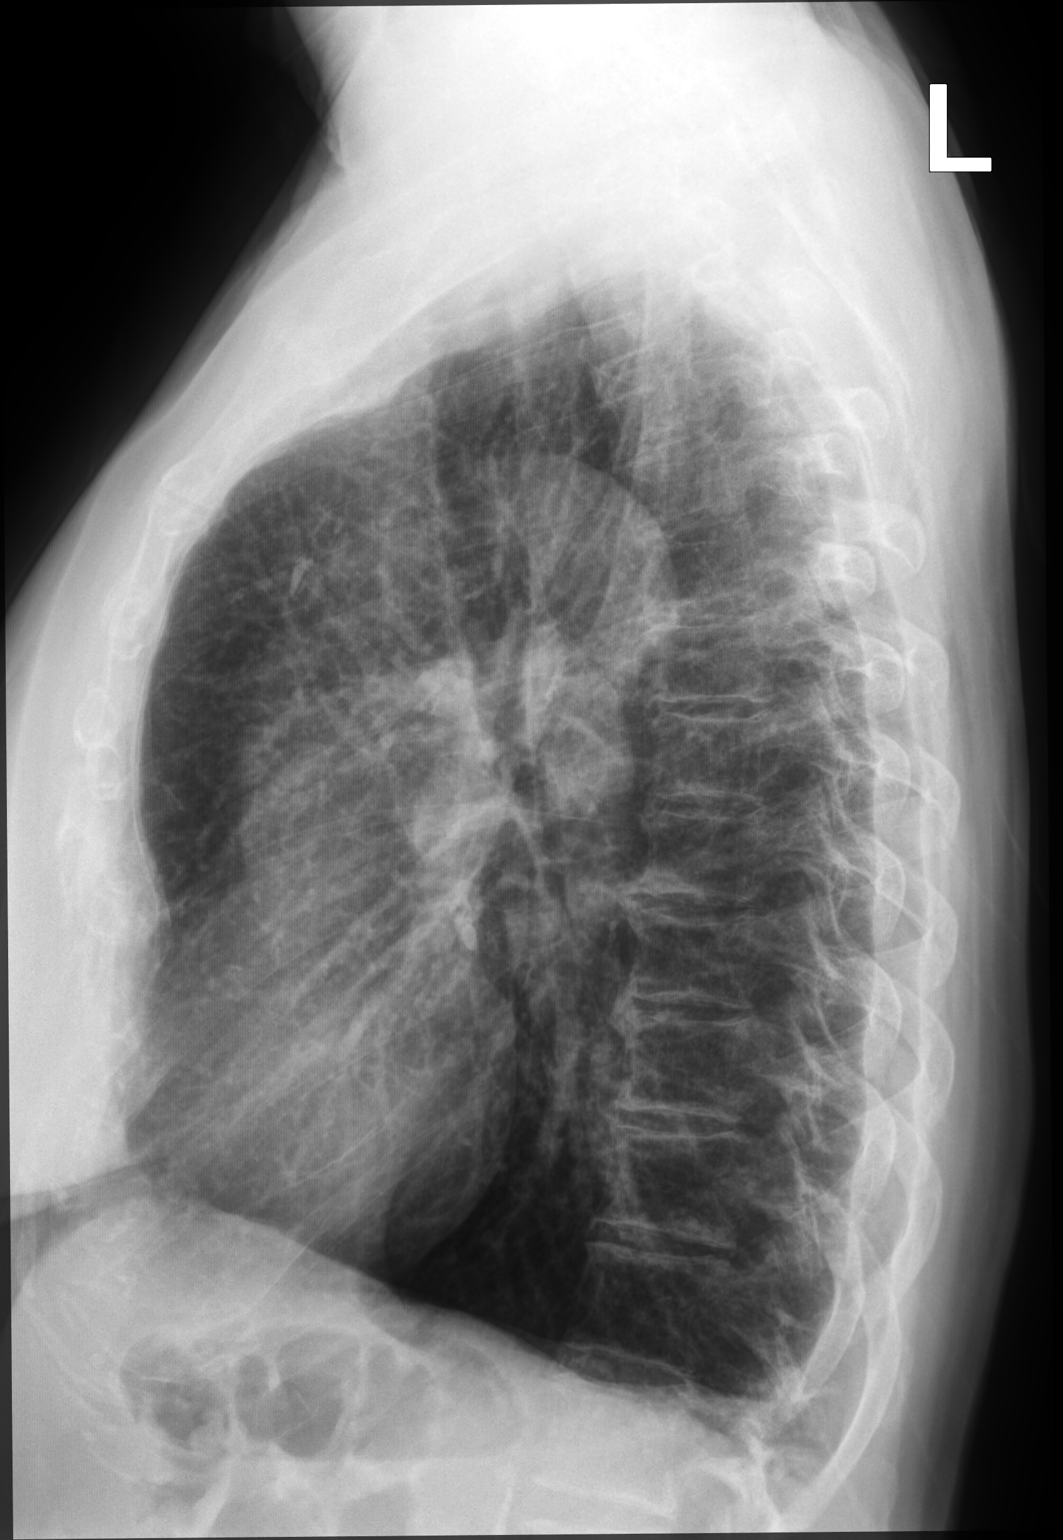

[2 of 2 positions shown; findings below may reference images not displayed]

FINDINGS: Cardiac shadows within normal limits. Lungs are hyperinflated
bilaterally. No focal infiltrate or sizable effusion is seen. Mild
vascular congestion is noted without edema. No bony abnormality is
seen.
IMPRESSION: COPD and mild vascular congestion.

## 2022-12-27 NOTE — Progress Notes (Signed)
Subjective:   Amanda Moss is a 67 y.o. female who presents for Medicare Annual (Subsequent) preventive examination.  Visit Complete: Virtual  I connected with  Amanda Moss on 12/27/22 by a audio enabled telemedicine application and verified that I am speaking with the correct person using two identifiers.  Patient Location: Home  Provider Location: Home Office  I discussed the limitations of evaluation and management by telemedicine. The patient expressed understanding and agreed to proceed.  Patient Medicare AWV questionnaire was completed by the patient on 12/23/22; I have confirmed that all information answered by patient is correct and no changes since this date.  Review of Systems     Cardiac Risk Factors include: advanced age (>56men, >56 women);hypertension     Objective:    Today's Vitals   12/27/22 0935  Weight: 127 lb (57.6 kg)  Height: 5' 0.5" (1.537 m)   Body mass index is 24.39 kg/m.     12/27/2022    9:45 AM 03/13/2022    8:05 AM 12/24/2021   10:07 AM 12/21/2020   11:30 AM  Advanced Directives  Does Patient Have a Medical Advance Directive? Yes Yes Yes Yes  Type of Estate agent of [place];Living will Healthcare Power of [place];Living will Healthcare Power of [place];Living will Healthcare Power of [place];Living will  Does patient want to make changes to medical advance directive?  No - Patient declined No - Patient declined   Copy of Healthcare Power of Attorney in Chart? No - copy requested No - copy requested No - copy requested No - copy requested    Current Medications (verified) Outpatient Encounter Medications as of 12/27/2022  Medication Sig   amLODipine (NORVASC) 5 MG tablet TAKE 1 AND 1/2 TABLETS BY MOUTH ONCE DAILY   budesonide (PULMICORT) 0.5 MG/2ML nebulizer solution Mix one ampule (1 dose) to of saline in rinse bottle; irrigate sinuses with 120 mL through each nostril daily. QTY: 30 day supply   Cholecalciferol  (VITAMIN D PO) Take by mouth.   clonazePAM (KLONOPIN) 0.5 MG tablet Take 1 tablet (0.5 mg total) by mouth daily as needed for anxiety.   Multiple Vitamins-Minerals (PRESERVISION AREDS PO) Take 1 tablet by mouth daily.    No facility-administered encounter medications on file as of 12/27/2022.    Allergies (verified) Codeine sulfate   History: Past Medical History:  Diagnosis Date   Bleeding 2003   hemorrhoidal bleeding   Hypertension    Squamous cell cancer of skin of nose    per patient treated by Dr Gerrianne Scale PAIN, RIGHT 06/20/2010   Qualifier: Diagnosis of  By: Rodena Medin MD, Acie Fredrickson     Tobacco use    Tobacco use disorder 09/21/2014   Vitamin D deficiency    Past Surgical History:  Procedure Laterality Date   CESAREAN SECTION     COSMETIC SURGERY  2001   eyes   NASAL RECONSTRUCTION     RHINOPLASTY     SQUAMOUS CELL CARCINOMA EXCISION     on nose; require reconstruction   Family History  Problem Relation Age of Onset   Dementia Mother    Cancer Father 42       lung   Hypertension Father    Pneumonia Father 57   Prostate cancer Brother    Diabetes Neg Hx    Social History   Socioeconomic History   Marital status: Divorced    Spouse name: Not on file   Number of children: 1  Years of education: Not on file   Highest education level: Not on file  Occupational History   Occupation: Adult nurse: [employer]  Tobacco Use   Smoking status: Former    Current packs/day: 0.00    Average packs/day: 0.5 packs/day for 40.0 years (20.0 ttl pk-yrs)    Types: Cigarettes    Start date: 08/1978    Quit date: 08/2018    Years since quitting: 4.3   Smokeless tobacco: Never  Substance and Sexual Activity   Alcohol use: Yes    Alcohol/week: 0.0 standard drinks of alcohol    Comment: 2-3 drinks a few days per week   Drug use: No   Sexual activity: Not on file  Other Topics Concern   Not on file  Social History Narrative   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 [employer] Situation:  Lives alone      Spiritual Beliefs: Catholic      Lifestyle: walking; diet is healthy      Social Determinants of Health   Financial Resource Strain: Low Risk  (12/27/2022)   Overall Financial Resource Strain (CARDIA)    Difficulty of Paying Living Expenses: Not hard at all  Food Insecurity: No Food Insecurity (12/27/2022)   Hunger Vital Sign    Worried About Running Out of Food in the Last Year: Never true    Ran Out of Food in the Last Year: Never true  Transportation Needs: No Transportation Needs (12/27/2022)   PRAPARE - Administrator, Civil Service (Medical): No    Lack of Transportation (Non-Medical): No  Physical Activity: Insufficiently Active (12/27/2022)   Exercise Vital Sign    Days of Exercise per Week: 2 days    Minutes of Exercise per Session: 20 min  Stress: No Stress Concern Present (12/27/2022)   [employer] of Occupational Health - Occupational Stress Questionnaire    Feeling of Stress : Not at all  Social Connections: Moderately Integrated (12/27/2022)   Social Connection and Isolation Panel [NHANES]    Frequency of Communication with Friends and Family: More than three times a week    Frequency of Social Gatherings with Friends and Family: More than three times a week    Attends Religious Services: More than 4 times per year    Active Member of [employer] or Organizations: Yes    Attends Engineer, structural: More than 4 times per year    Marital Status: Divorced    Tobacco Counseling Counseling given: Not Answered   Clinical Intake:  Pre-visit preparation completed: Yes  Pain : No/denies pain     BMI - recorded: 24.39 Nutritional Status: BMI of 19-24  Normal Nutritional Risks: None Diabetes: No  How often do you need to have someone help you when you read instructions, pamphlets, or other written materials from your doctor or pharmacy?: 1 -  Never  Interpreter Needed?: No  Information entered by :: Amanda Mulligan LPN   Activities of Daily Living    12/27/2022    9:43 AM 12/23/2022    8:52 AM  In your present state of health, do you have any difficulty performing the following activities:  Hearing? 0 0  Vision? 0 0  Difficulty concentrating or making decisions? 0 0  Walking or climbing stairs? 0 0  Dressing or bathing? 0 0  Doing errands, shopping? 0 0  Preparing Food and  eating ? N N  Using the Toilet? N N  In the past six months, have you accidently leaked urine? N Y  Do you have problems with loss of bowel control? N N  Managing your Medications? N N  Managing your Finances? N N  Housekeeping or managing your Housekeeping? N N    Patient Care Team: Karie Georges, MD as PCP - General (Family Medicine) Richarda Overlie, MD as Consulting Physician (Obstetrics and Gynecology)  Indicate any recent Medical Services you may have received from other than [facility] providers in the past year (date may be approximate).     Assessment:   This is a routine wellness examination for [employer].  Hearing/Vision screen Hearing Screening - Comments:: Denies hearing difficulties   Vision Screening - Comments:: Wears rx Contacts - up to date with routine eye exams with [facility]   Dietary issues and exercise activities discussed:     Goals Addressed               This Visit's Progress     Increase physical activity (pt-stated)         Depression Screen    12/27/2022    9:41 AM 09/17/2022    8:00 AM 03/06/2022   10:40 AM 12/24/2021   10:02 AM 09/14/2021    3:21 PM 12/21/2020   11:32 AM 12/21/2020   11:29 AM  PHQ 2/9 Scores  PHQ - 2 Score 0 1 0 0 1 0 0  PHQ- 9 Score  1 3        Fall Risk    12/27/2022    9:43 AM 12/23/2022    8:52 AM 03/06/2022   10:42 AM 12/24/2021   10:06 AM 09/14/2021    3:17 PM  Fall Risk   Falls in the past year? 0 0 0 0 0  Number falls in past yr: 0  0 0 0  Injury with Fall? 0  0 0 0   Risk for fall due to : No Fall Risks  No Fall Risks No Fall Risks No Fall Risks  Follow up Falls prevention discussed  Falls evaluation completed  Falls evaluation completed    MEDICARE RISK AT HOME:  Medicare Risk at Home - 12/27/22 0952     Any stairs in or around the home? Yes    If so, are there any without handrails? No    Home free of loose throw rugs in walkways, pet beds, electrical cords, etc? Yes    Adequate lighting in your home to reduce risk of falls? Yes    Life alert? No    Use of a cane, walker or w/c? No    Grab bars in the bathroom? No    Shower chair or bench in shower? No    Elevated toilet seat or a handicapped toilet? No             TIMED UP AND GO:  Was the test performed?  No    Cognitive Function:        12/27/2022    9:45 AM 12/24/2021   10:07 AM  6CIT Screen  What Year? 0 points 0 points  What month? 0 points 0 points  What time? 0 points 0 points  Count back from 20 0 points 0 points  Months in reverse 0 points 0 points  Repeat phrase 0 points 0 points  Total Score 0 points 0 points    Immunizations Immunization History  Administered Date(s) Administered  Influenza-Unspecified 03/17/2014, 03/18/2015, 04/17/2018   PFIZER(Purple Top)SARS-COV-2 Vaccination 07/31/2019, 08/25/2019, 03/17/2020   PNEUMOCOCCAL CONJUGATE-20 09/06/2020   Tdap 03/02/2014   Zoster Recombinant(Shingrix) 08/20/2021, 11/11/2021   Zoster, Live 09/21/2014    TDAP status: Up to date  Flu Vaccine status: Declined, Education has been provided regarding the importance of this vaccine but patient still declined. Advised may receive this vaccine at local pharmacy or Health Dept. Aware to provide a copy of the vaccination record if obtained from local pharmacy or Health Dept. Verbalized acceptance and understanding.  Pneumococcal vaccine status: Up to date  Covid-19 vaccine status: Completed vaccines  Qualifies for Shingles Vaccine? Yes   Zostavax completed Yes    Shingrix Completed?: Yes  Screening Tests Health Maintenance  Topic Date Due   COVID-19 Vaccine (4 - 2023-24 season) 02/15/2022   Lung Cancer Screening  12/21/2022   INFLUENZA VACCINE  01/16/2023   MAMMOGRAM  02/28/2023   Colonoscopy  05/26/2023   Medicare Annual Wellness (AWV)  12/27/2023   DTaP/Tdap/Td (2 - Td or Tdap) 03/02/2024   Pneumonia Vaccine 36+ Years old  Completed   DEXA SCAN  Completed   Hepatitis C Screening  Completed   Zoster Vaccines- Shingrix  Completed   HPV VACCINES  Aged Out    Health Maintenance  Health Maintenance Due  Topic Date Due   COVID-19 Vaccine (4 - 2023-24 season) 02/15/2022   Lung Cancer Screening  12/21/2022    Colorectal cancer screening: Type of screening: Colonoscopy. Completed 02/27/22. Repeat every 1 years  Mammogram status: Completed 02/28/23. Repeat every year  Bone Density status: Completed 06/17/17. Results reflect: Bone density results: OSTEOPENIA. Repeat every 10 years.  Lung Cancer Screening: (Low Dose CT Chest recommended if Age 96-80 years, 20 pack-year currently smoking OR have quit w/in 15years.) does not qualify.     Additional Screening:  Hepatitis C Screening: does qualify; Completed 07/11/16  Vision Screening: Recommended annual ophthalmology exams for early detection of glaucoma and other disorders of the eye. Is the patient up to date with their annual eye exam?  Yes  Who is the provider or what is the name of the office in which the patient attends annual eye exams? [facility] If pt is not established with a provider, would they like to be referred to a provider to establish care? No .   Dental Screening: Recommended annual dental exams for proper oral hygiene    Community Resource Referral / Chronic Care Management:  CRR required this visit?  No   CCM required this visit?  No     Plan:     I have personally reviewed and noted the following in the patient's chart:   Medical and social history Use  of alcohol, tobacco or illicit drugs  Current medications and supplements including opioid prescriptions. Patient is not currently taking opioid prescriptions. Functional ability and status Nutritional status Physical activity Advanced directives List of other physicians Hospitalizations, surgeries, and ER visits in previous 12 months Vitals Screenings to include cognitive, depression, and falls Referrals and appointments  In addition, I have reviewed and discussed with patient certain preventive protocols, quality metrics, and best practice recommendations. A written personalized care plan for preventive services as well as general preventive health recommendations were provided to patient.     Tillie Rung, LPN   [id number]   After Visit Summary: (MyChart) Due to this being a telephonic visit, the after visit summary with patients personalized plan was offered to patient via MyChart   Nurse  Notes: None

## 2022-12-27 NOTE — Patient Instructions (Addendum)
Amanda Moss , Thank you for taking time to come for your Medicare Wellness Visit. I appreciate your ongoing commitment to your health goals. Please review the following plan we discussed and let me know if I can assist you in the future.   These are the goals we discussed:  Goals       Increase physical activity (pt-stated)      No current goals (pt-stated)        This is a list of the screening recommended for you and due dates:  Health Maintenance  Topic Date Due   COVID-19 Vaccine (4 - 2023-24 season) 02/15/2022   Screening for Lung Cancer  12/21/2022   Flu Shot  01/16/2023   Mammogram  02/28/2023   Colon Cancer Screening  05/26/2023   Medicare Annual Wellness Visit  12/27/2023   DTaP/Tdap/Td vaccine (2 - Td or Tdap) 03/02/2024   Pneumonia Vaccine  Completed   DEXA scan (bone density measurement)  Completed   Hepatitis C Screening  Completed   Zoster (Shingles) Vaccine  Completed   HPV Vaccine  Aged Out    Advanced directives: Please bring a copy of your health care power of attorney and living will to the office to be added to your chart at your convenience.   Conditions/risks identified: None  Next appointment: Follow up in one year for your annual wellness visit    Preventive Care 65 Years and Older, Female Preventive care refers to lifestyle choices and visits with your health care provider that can promote health and wellness. What does preventive care include? A yearly physical exam. This is also called an annual well check. Dental exams once or twice a year. Routine eye exams. Ask your health care provider how often you should have your eyes checked. Personal lifestyle choices, including: Daily care of your teeth and gums. Regular physical activity. Eating a healthy diet. Avoiding tobacco and drug use. Limiting alcohol use. Practicing safe sex. Taking low-dose aspirin every day. Taking vitamin and mineral supplements as recommended by your health care  provider. What happens during an annual well check? The services and screenings done by your health care provider during your annual well check will depend on your age, overall health, lifestyle risk factors, and family history of disease. Counseling  Your health care provider may ask you questions about your: Alcohol use. Tobacco use. Drug use. Emotional well-being. Home and relationship well-being. Sexual activity. Eating habits. History of falls. Memory and ability to understand (cognition). Work and work Astronomer. Reproductive health. Screening  You may have the following tests or measurements: Height, weight, and BMI. Blood pressure. Lipid and cholesterol levels. These may be checked every 5 years, or more frequently if you are over 14 years old. Skin check. Lung cancer screening. You may have this screening every year starting at age 49 if you have a 30-pack-year history of smoking and currently smoke or have quit within the past 15 years. Fecal occult blood test (FOBT) of the stool. You may have this test every year starting at age 25. Flexible sigmoidoscopy or colonoscopy. You may have a sigmoidoscopy every 5 years or a colonoscopy every 10 years starting at age 80. Hepatitis C blood test. Hepatitis B blood test. Sexually transmitted disease (STD) testing. Diabetes screening. This is done by checking your blood sugar (glucose) after you have not eaten for a while (fasting). You may have this done every 1-3 years. Bone density scan. This is done to screen for osteoporosis. You may have  this done starting at age 20. Mammogram. This may be done every 1-2 years. Talk to your health care provider about how often you should have regular mammograms. Talk with your health care provider about your test results, treatment options, and if necessary, the need for more tests. Vaccines  Your health care provider may recommend certain vaccines, such as: Influenza vaccine. This is  recommended every year. Tetanus, diphtheria, and acellular pertussis (Tdap, Td) vaccine. You may need a Td booster every 10 years. Zoster vaccine. You may need this after age 81. Pneumococcal 13-valent conjugate (PCV13) vaccine. One dose is recommended after age 61. Pneumococcal polysaccharide (PPSV23) vaccine. One dose is recommended after age 72. Talk to your health care provider about which screenings and vaccines you need and how often you need them. This information is not intended to replace advice given to you by your health care provider. Make sure you discuss any questions you have with your health care provider. Document Released: 06/30/2015 Document Revised: 02/21/2016 Document Reviewed: 04/04/2015 Elsevier Interactive Patient Education  2017 [employer].  Fall Prevention in the Home Falls can cause injuries. They can happen to people of all ages. There are many things you can do to make your home safe and to help prevent falls. What can I do on the outside of my home? Regularly fix the edges of walkways and driveways and fix any cracks. Remove anything that might make you trip as you walk through a door, such as a raised step or threshold. Trim any bushes or trees on the path to your home. Use bright outdoor lighting. Clear any walking paths of anything that might make someone trip, such as rocks or tools. Regularly check to see if handrails are loose or broken. Make sure that both sides of any steps have handrails. Any raised decks and porches should have guardrails on the edges. Have any leaves, snow, or ice cleared regularly. Use sand or salt on walking paths during winter. Clean up any spills in your garage right away. This includes oil or grease spills. What can I do in the bathroom? Use night lights. Install grab bars by the toilet and in the tub and shower. Do not use towel bars as grab bars. Use non-skid mats or decals in the tub or shower. If you need to sit down in  the shower, use a plastic, non-slip stool. Keep the floor dry. Clean up any water that spills on the floor as soon as it happens. Remove soap buildup in the tub or shower regularly. Attach bath mats securely with double-sided non-slip rug tape. Do not have throw rugs and other things on the floor that can make you trip. What can I do in the bedroom? Use night lights. Make sure that you have a light by your bed that is easy to reach. Do not use any sheets or blankets that are too big for your bed. They should not hang down onto the floor. Have a firm chair that has side arms. You can use this for support while you get dressed. Do not have throw rugs and other things on the floor that can make you trip. What can I do in the kitchen? Clean up any spills right away. Avoid walking on wet floors. Keep items that you use a lot in easy-to-reach places. If you need to reach something above you, use a strong step stool that has a grab bar. Keep electrical cords out of the way. Do not use floor polish or  wax that makes floors slippery. If you must use wax, use non-skid floor wax. Do not have throw rugs and other things on the floor that can make you trip. What can I do with my stairs? Do not leave any items on the stairs. Make sure that there are handrails on both sides of the stairs and use them. Fix handrails that are broken or loose. Make sure that handrails are as long as the stairways. Check any carpeting to make sure that it is firmly attached to the stairs. Fix any carpet that is loose or worn. Avoid having throw rugs at the top or bottom of the stairs. If you do have throw rugs, attach them to the floor with carpet tape. Make sure that you have a light switch at the top of the stairs and the bottom of the stairs. If you do not have them, ask someone to add them for you. What else can I do to help prevent falls? Wear shoes that: Do not have high heels. Have rubber bottoms. Are comfortable  and fit you well. Are closed at the toe. Do not wear sandals. If you use a stepladder: Make sure that it is fully opened. Do not climb a closed stepladder. Make sure that both sides of the stepladder are locked into place. Ask someone to hold it for you, if possible. Clearly mark and make sure that you can see: Any grab bars or handrails. First and last steps. Where the edge of each step is. Use tools that help you move around (mobility aids) if they are needed. These include: Canes. Walkers. Scooters. Crutches. Turn on the lights when you go into a dark area. Replace any light bulbs as soon as they burn out. Set up your furniture so you have a clear path. Avoid moving your furniture around. If any of your floors are uneven, fix them. If there are any pets around you, be aware of where they are. Review your medicines with your doctor. Some medicines can make you feel dizzy. This can increase your chance of falling. Ask your doctor what other things that you can do to help prevent falls. This information is not intended to replace advice given to you by your health care provider. Make sure you discuss any questions you have with your health care provider. Document Released: 03/30/2009 Document Revised: 11/09/2015 Document Reviewed: 07/08/2014 Elsevier Interactive Patient Education  2017 [employer].

## 2022-12-30 ENCOUNTER — Ambulatory Visit
Admission: RE | Admit: 2022-12-30 | Discharge: 2022-12-30 | Disposition: A | Discharge status: Home / Self Care | Payer: Medicare HMO | Source: Ambulatory Visit | Attending: Family Medicine | Admitting: Family Medicine

## 2022-12-30 DIAGNOSIS — Z87891 Personal history of nicotine dependence: Secondary | ICD-10-CM

## 2022-12-30 DIAGNOSIS — Z122 Encounter for screening for malignant neoplasm of respiratory organs: Secondary | ICD-10-CM

## 2023-01-01 ENCOUNTER — Other Ambulatory Visit: Payer: Self-pay

## 2023-01-01 DIAGNOSIS — Z87891 Personal history of nicotine dependence: Secondary | ICD-10-CM

## 2023-01-01 DIAGNOSIS — Z122 Encounter for screening for malignant neoplasm of respiratory organs: Secondary | ICD-10-CM

## 2023-01-01 DIAGNOSIS — H40033 Anatomical narrow angle, bilateral: Secondary | ICD-10-CM | POA: Diagnosis not present

## 2023-01-13 DIAGNOSIS — Z85828 Personal history of other malignant neoplasm of skin: Secondary | ICD-10-CM | POA: Diagnosis not present

## 2023-01-13 DIAGNOSIS — L738 Other specified follicular disorders: Secondary | ICD-10-CM | POA: Diagnosis not present

## 2023-01-13 DIAGNOSIS — L821 Other seborrheic keratosis: Secondary | ICD-10-CM | POA: Diagnosis not present

## 2023-01-13 DIAGNOSIS — D225 Melanocytic nevi of trunk: Secondary | ICD-10-CM | POA: Diagnosis not present

## 2023-01-13 DIAGNOSIS — L570 Actinic keratosis: Secondary | ICD-10-CM | POA: Diagnosis not present

## 2023-01-13 DIAGNOSIS — L820 Inflamed seborrheic keratosis: Secondary | ICD-10-CM | POA: Diagnosis not present

## 2023-01-21 DIAGNOSIS — C300 Malignant neoplasm of nasal cavity: Secondary | ICD-10-CM | POA: Diagnosis not present

## 2023-01-21 DIAGNOSIS — J3489 Other specified disorders of nose and nasal sinuses: Secondary | ICD-10-CM | POA: Diagnosis not present

## 2023-01-27 ENCOUNTER — Encounter: Payer: Self-pay | Admitting: Family Medicine

## 2023-01-28 ENCOUNTER — Encounter: Payer: Self-pay | Admitting: Family Medicine

## 2023-01-28 ENCOUNTER — Ambulatory Visit (INDEPENDENT_AMBULATORY_CARE_PROVIDER_SITE_OTHER): Payer: Medicare HMO | Admitting: Family Medicine

## 2023-01-28 VITALS — BP 128/80 | HR 56 | Temp 97.5°F | Wt 130.6 lb

## 2023-01-28 DIAGNOSIS — I10 Essential (primary) hypertension: Secondary | ICD-10-CM

## 2023-01-28 DIAGNOSIS — M25471 Effusion, right ankle: Secondary | ICD-10-CM | POA: Diagnosis not present

## 2023-01-28 DIAGNOSIS — M25472 Effusion, left ankle: Secondary | ICD-10-CM | POA: Diagnosis not present

## 2023-01-28 MED ORDER — AMLODIPINE BESYLATE 5 MG PO TABS: 5.0000 mg | ORAL_TABLET | Freq: Every day | ORAL | Status: DC

## 2023-01-28 NOTE — Progress Notes (Signed)
   Subjective:    Patient ID: Amanda Moss, female    DOB: June 27, 1955, 67 y.o.   MRN: [id number]  HPI Here for some swelling in both feet that she noticed about 2 weeks ago. This was never painful. No SOB. The swelling then started going down again, and today she is back to normal. She had labs in April showing normal renal function. She admits to eating out more than usual this past month, eating salty foods and drinking more alcohol than usual. Her BP has been stable.    Review of Systems  Constitutional: Negative.   Respiratory: Negative.    Cardiovascular:  Positive for leg swelling. Negative for chest pain and palpitations.       Objective:   Physical Exam Constitutional:      Appearance: Normal appearance.  Cardiovascular:     Rate and Rhythm: Normal rate and regular rhythm.     Pulses: Normal pulses.     Heart sounds: Normal heart sounds.  Pulmonary:     Effort: Pulmonary effort is normal.     Breath sounds: Normal breath sounds.  Musculoskeletal:     Right lower leg: No edema.     Left lower leg: No edema.  Neurological:     Mental Status: She is alert.           Assessment & Plan:  She had transient ankle edema, and this was likely due to a combination of things including the recent hot weather, likely effects of the Amlodipine, and higher sodium intact than usual. We agreed to decrease the Amlodipine to 5 mg daily. She will pay more attention to her sodium intake. We will also check a TSH today.  Gershon Crane, MD

## 2023-02-05 NOTE — Telephone Encounter (Signed)
-----   Message from Karie Georges sent at 02/05/2023 12:22 PM EDT ----- Please let pt know that her CT was negative for suspicious findings. Recommendation is to repeat in 12 months. Thanks! ----- Message ----- From: Karlton Lemon, RN Sent: 01/01/2023   8:30 AM EDT To: Karie Georges, MD

## 2023-02-05 NOTE — Telephone Encounter (Signed)
Patient informed of the results

## 2023-03-27 ENCOUNTER — Encounter: Payer: Self-pay | Admitting: Family Medicine

## 2023-03-31 DIAGNOSIS — H25813 Combined forms of age-related cataract, bilateral: Secondary | ICD-10-CM | POA: Diagnosis not present

## 2023-03-31 DIAGNOSIS — H35033 Hypertensive retinopathy, bilateral: Secondary | ICD-10-CM | POA: Diagnosis not present

## 2023-03-31 DIAGNOSIS — H353132 Nonexudative age-related macular degeneration, bilateral, intermediate dry stage: Secondary | ICD-10-CM | POA: Diagnosis not present

## 2023-03-31 DIAGNOSIS — H25811 Combined forms of age-related cataract, right eye: Secondary | ICD-10-CM | POA: Diagnosis not present

## 2023-03-31 DIAGNOSIS — H40033 Anatomical narrow angle, bilateral: Secondary | ICD-10-CM | POA: Diagnosis not present

## 2023-04-01 MED ORDER — AMLODIPINE BESYLATE 5 MG PO TABS: 7.5000 mg | ORAL_TABLET | Freq: Every day | ORAL | Status: DC

## 2023-04-01 NOTE — Telephone Encounter (Signed)
I updated her medications

## 2023-04-12 ENCOUNTER — Other Ambulatory Visit: Payer: Self-pay | Admitting: Family Medicine

## 2023-04-29 DIAGNOSIS — H25811 Combined forms of age-related cataract, right eye: Secondary | ICD-10-CM | POA: Diagnosis not present

## 2023-04-29 DIAGNOSIS — H268 Other specified cataract: Secondary | ICD-10-CM | POA: Diagnosis not present

## 2023-05-05 DIAGNOSIS — H25812 Combined forms of age-related cataract, left eye: Secondary | ICD-10-CM | POA: Diagnosis not present

## 2023-05-20 DIAGNOSIS — H25812 Combined forms of age-related cataract, left eye: Secondary | ICD-10-CM | POA: Diagnosis not present

## 2023-06-02 DIAGNOSIS — Z01419 Encounter for gynecological examination (general) (routine) without abnormal findings: Secondary | ICD-10-CM | POA: Diagnosis not present

## 2023-06-02 DIAGNOSIS — R319 Hematuria, unspecified: Secondary | ICD-10-CM | POA: Diagnosis not present

## 2023-06-02 DIAGNOSIS — R350 Frequency of micturition: Secondary | ICD-10-CM | POA: Diagnosis not present

## 2023-06-16 ENCOUNTER — Encounter: Payer: Self-pay | Admitting: Family Medicine

## 2023-06-16 ENCOUNTER — Other Ambulatory Visit: Payer: Self-pay | Admitting: Family Medicine

## 2023-06-16 ENCOUNTER — Ambulatory Visit (INDEPENDENT_AMBULATORY_CARE_PROVIDER_SITE_OTHER): Payer: Medicare HMO | Admitting: Family Medicine

## 2023-06-16 VITALS — BP 120/82 | HR 100 | Temp 98.2°F

## 2023-06-16 DIAGNOSIS — J019 Acute sinusitis, unspecified: Secondary | ICD-10-CM

## 2023-06-16 DIAGNOSIS — R0989 Other specified symptoms and signs involving the circulatory and respiratory systems: Secondary | ICD-10-CM | POA: Diagnosis not present

## 2023-06-16 LAB — POCT INFLUENZA A/B
Influenza A, POC: NEGATIVE
Influenza B, POC: NEGATIVE

## 2023-06-16 LAB — POC COVID19 BINAXNOW: SARS Coronavirus 2 Ag: NEGATIVE

## 2023-06-16 IMAGING — MG MM DIGITAL SCREENING BILAT W/ TOMO AND CAD
6 of 10 series · 6 of 30 positions shown · non-contrast
Comparison: Previous exam(s).

CLINICAL DATA: Screening.

EXAM:
DIGITAL SCREENING BILATERAL MAMMOGRAM WITH TOMOSYNTHESIS AND CAD
TECHNIQUE: Bilateral screening digital craniocaudal and mediolateral oblique
mammograms were obtained. Bilateral screening digital breast
tomosynthesis was performed. The images were evaluated with
computer-aided detection.

[R CC synth-2D]
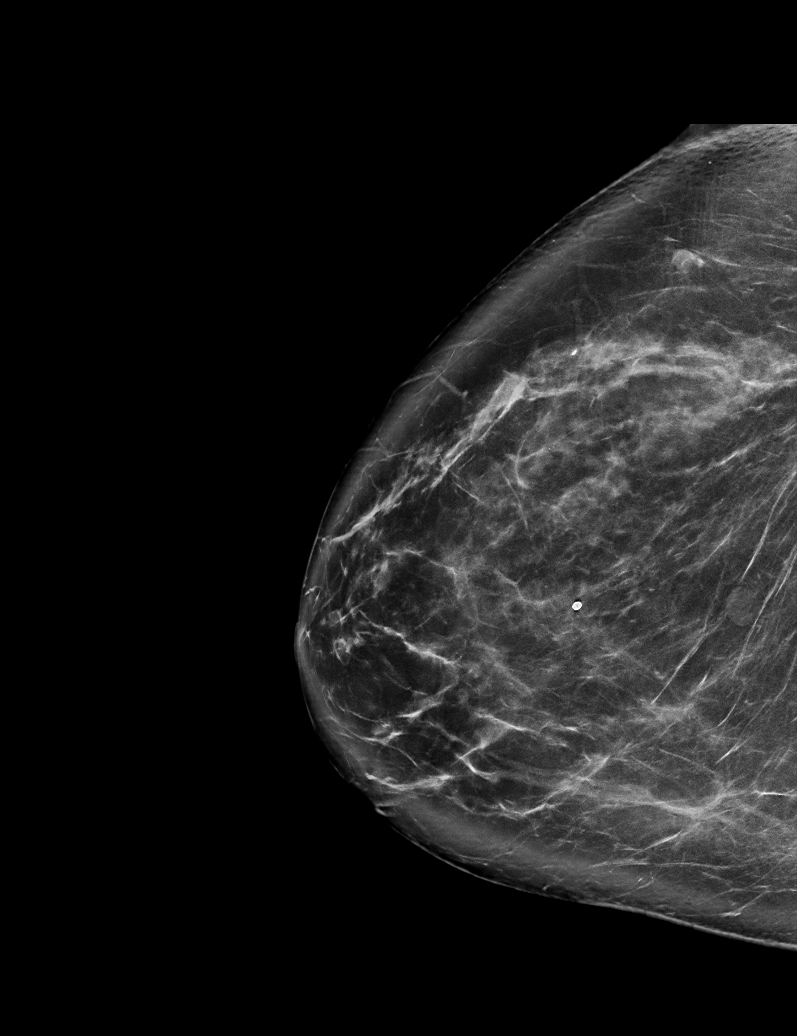

[L MLO synth-2D (1 of 2)]
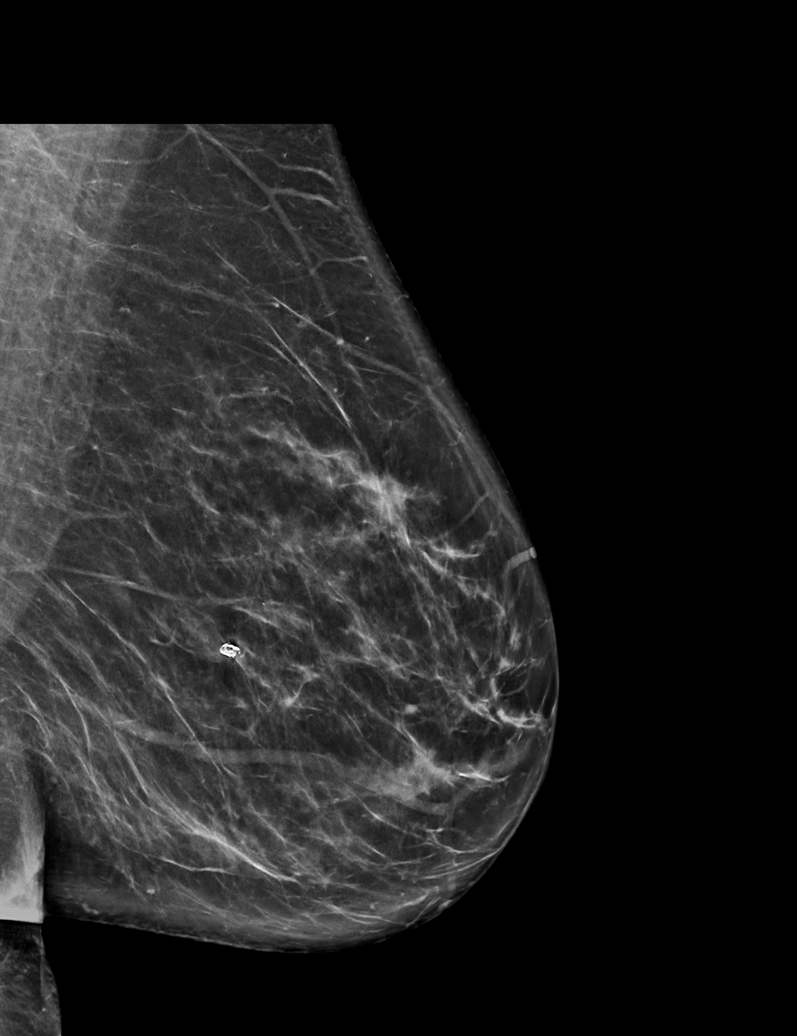

[R MLO synth-2D]
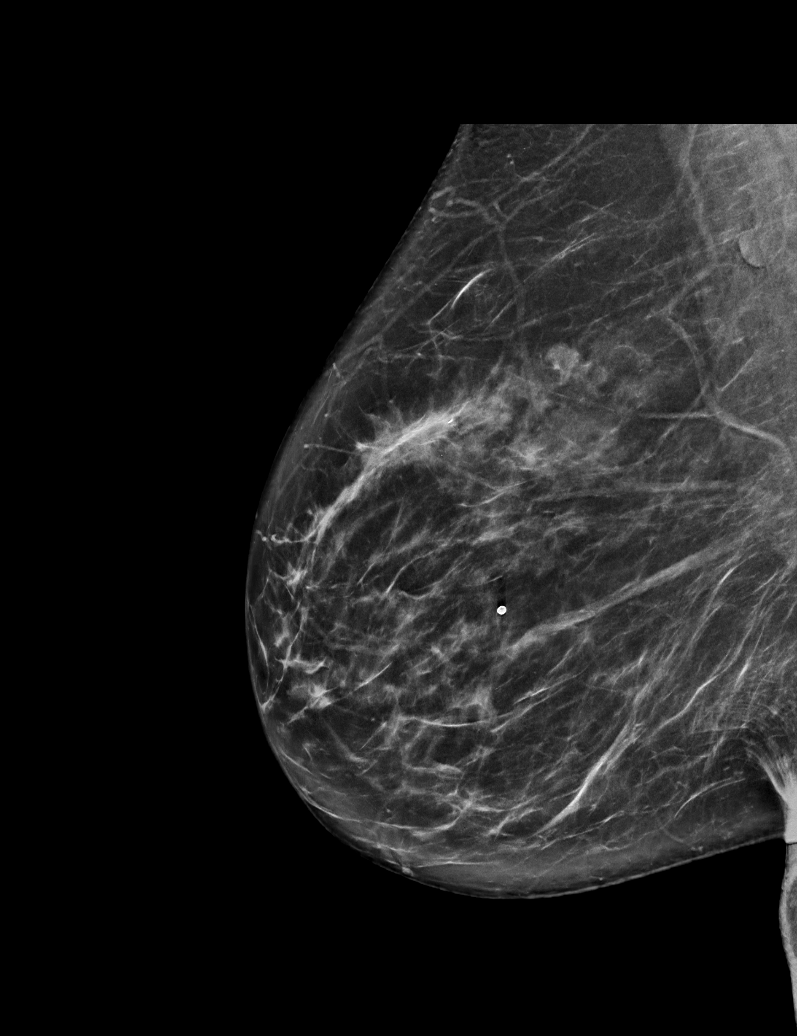

[L MLO synth-2D (2 of 2)]
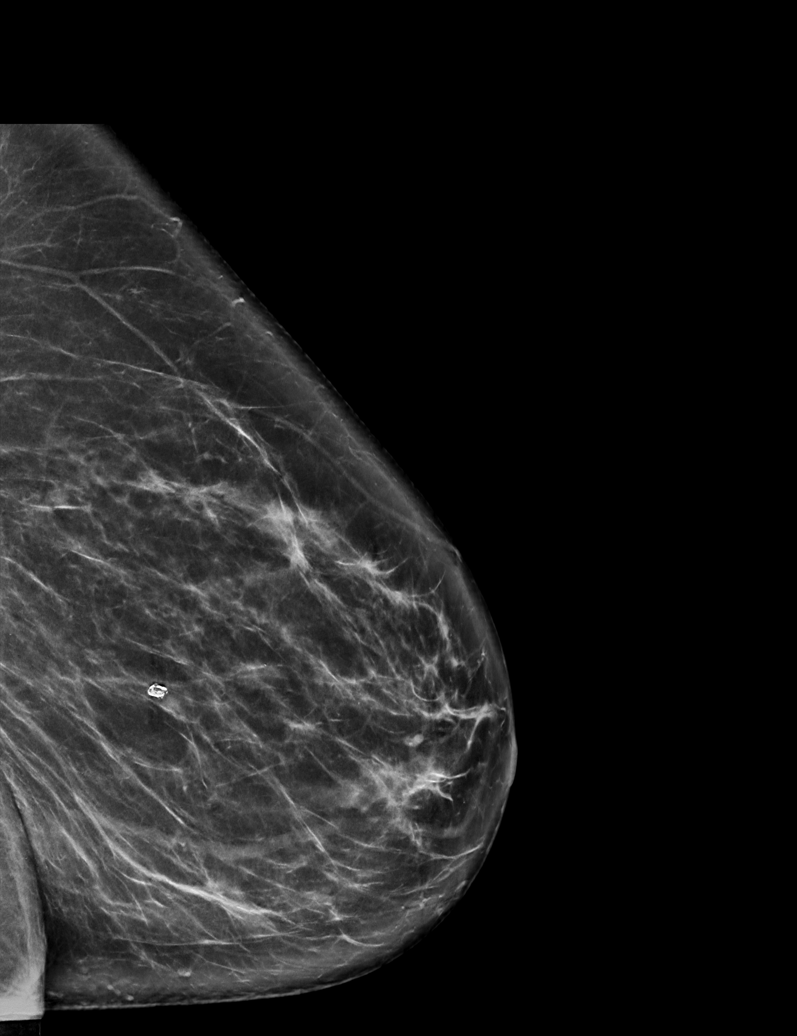

[L CC synth-2D]
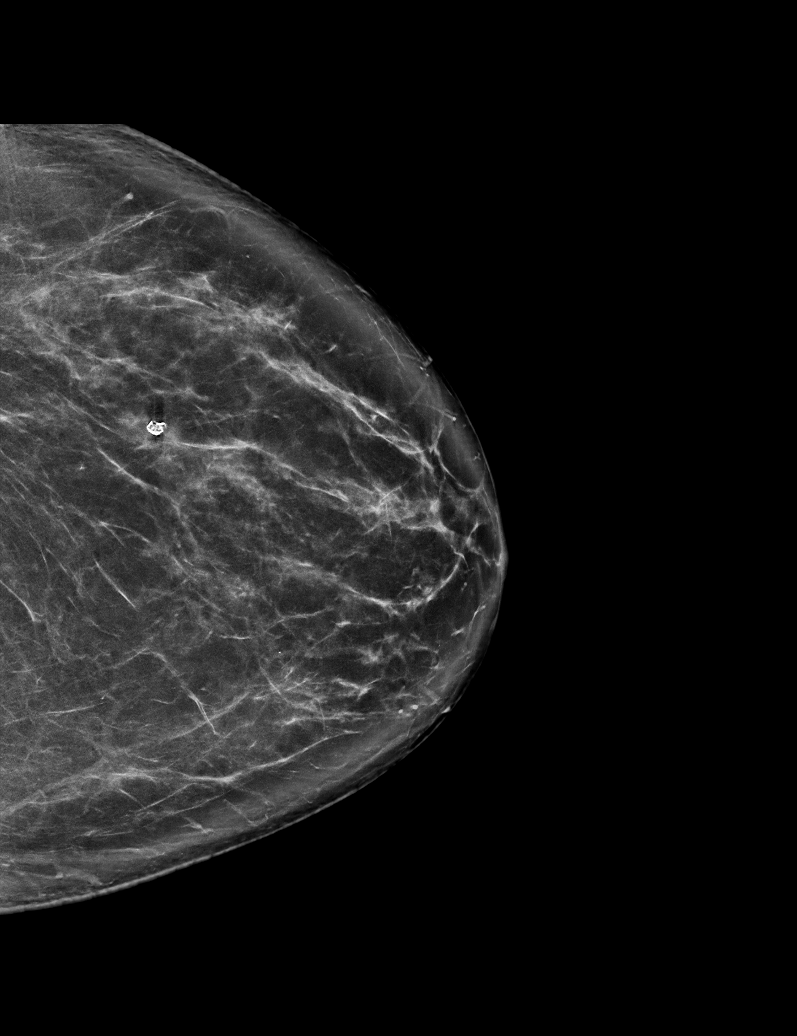

[L MLO tomo · tomo slice 39/76.0]
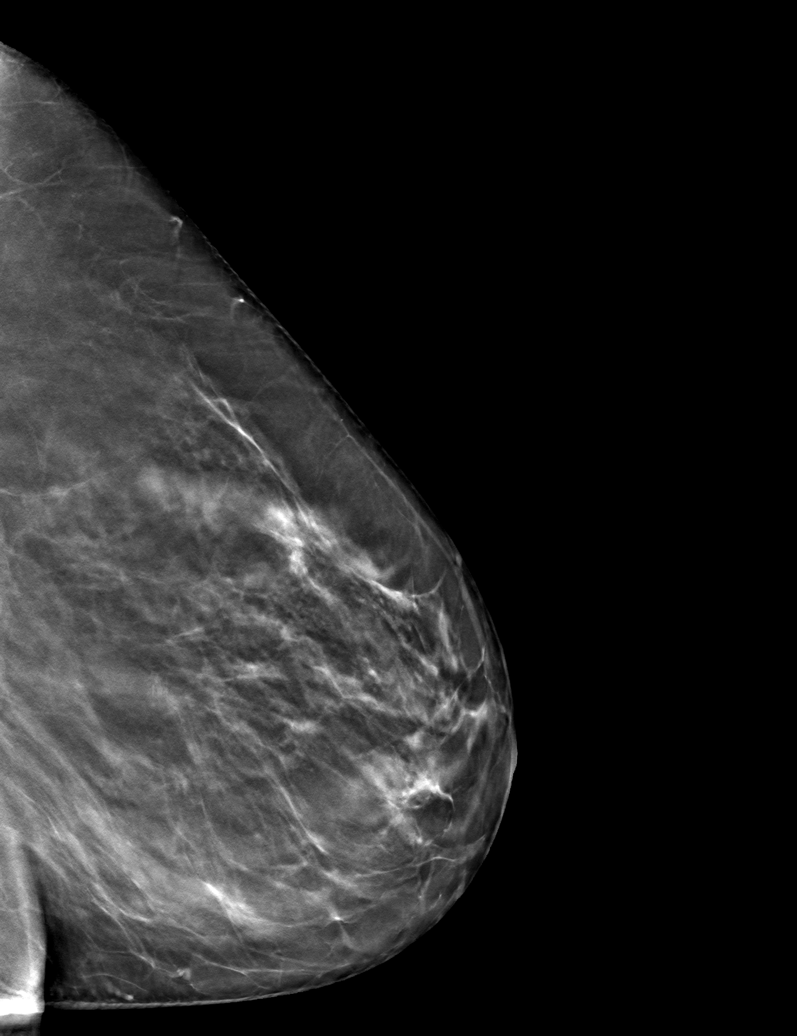

[6 of 30 positions shown; findings below may reference images not displayed]

ACR Breast Density Category c: The breast tissue is heterogeneously
dense, which may obscure small masses.
FINDINGS: There are no findings suspicious for malignancy.
IMPRESSION: No mammographic evidence of malignancy. A result letter of this
screening mammogram will be mailed directly to the patient.

RECOMMENDATION:
Screening mammogram in one year. (Code:Q3-W-BC3)

BI-RADS CATEGORY  1: Negative.

## 2023-06-16 MED ORDER — AMOXICILLIN-POT CLAVULANATE 875-125 MG PO TABS
1.0000 | ORAL_TABLET | Freq: Two times a day (BID) | ORAL | 0 refills | Status: DC
Start: 2023-06-16 — End: 2023-09-18

## 2023-06-16 NOTE — Addendum Note (Signed)
Addended by: Carola Rhine on: 06/16/2023 02:05 PM   Modules accepted: Orders

## 2023-06-16 NOTE — Progress Notes (Signed)
   Subjective:    Patient ID: Amanda Moss, female    DOB: 1956-05-06, 67 y.o.   MRN: [id number]  HPI Here for 5 days of sinus congestion, PND, and coughing up green sputum. No fever or ST or SOB.    Review of Systems  Constitutional: Negative.   HENT:  Positive for congestion, postnasal drip and sinus pressure. Negative for ear pain and sore throat.   Eyes: Negative.   Respiratory:  Positive for cough. Negative for shortness of breath and wheezing.        Objective:   Physical Exam Constitutional:      Appearance: Normal appearance. She is not ill-appearing.  HENT:     Right Ear: Tympanic membrane, ear canal and external ear normal.     Left Ear: Tympanic membrane, ear canal and external ear normal.     Mouth/Throat:     Pharynx: Oropharynx is clear.  Eyes:     Conjunctiva/sclera: Conjunctivae normal.  Pulmonary:     Effort: Pulmonary effort is normal.     Breath sounds: Normal breath sounds.  Lymphadenopathy:     Cervical: No cervical adenopathy.  Neurological:     Mental Status: She is alert.           Assessment & Plan:  Sinusitis, treat with 10 days of Augmentin. Add Mucinex BID. Gershon Crane, MD

## 2023-07-10 DIAGNOSIS — K635 Polyp of colon: Secondary | ICD-10-CM | POA: Diagnosis not present

## 2023-07-10 DIAGNOSIS — Z860101 Personal history of adenomatous and serrated colon polyps: Secondary | ICD-10-CM | POA: Diagnosis not present

## 2023-07-10 DIAGNOSIS — K621 Rectal polyp: Secondary | ICD-10-CM | POA: Diagnosis not present

## 2023-07-10 DIAGNOSIS — Z09 Encounter for follow-up examination after completed treatment for conditions other than malignant neoplasm: Secondary | ICD-10-CM | POA: Diagnosis not present

## 2023-07-15 DIAGNOSIS — K621 Rectal polyp: Secondary | ICD-10-CM | POA: Diagnosis not present

## 2023-07-15 DIAGNOSIS — K635 Polyp of colon: Secondary | ICD-10-CM | POA: Diagnosis not present

## 2023-09-18 ENCOUNTER — Encounter: Payer: Self-pay | Admitting: Family Medicine

## 2023-09-18 ENCOUNTER — Ambulatory Visit (INDEPENDENT_AMBULATORY_CARE_PROVIDER_SITE_OTHER): Payer: Medicare HMO | Admitting: Family Medicine

## 2023-09-18 VITALS — BP 104/70 | HR 75 | Temp 98.2°F | Ht 60.5 in | Wt 131.9 lb

## 2023-09-18 DIAGNOSIS — Z13220 Encounter for screening for lipoid disorders: Secondary | ICD-10-CM | POA: Diagnosis not present

## 2023-09-18 DIAGNOSIS — R739 Hyperglycemia, unspecified: Secondary | ICD-10-CM | POA: Diagnosis not present

## 2023-09-18 DIAGNOSIS — I10 Essential (primary) hypertension: Secondary | ICD-10-CM | POA: Diagnosis not present

## 2023-09-18 DIAGNOSIS — Z1231 Encounter for screening mammogram for malignant neoplasm of breast: Secondary | ICD-10-CM | POA: Diagnosis not present

## 2023-09-18 DIAGNOSIS — Z87891 Personal history of nicotine dependence: Secondary | ICD-10-CM | POA: Diagnosis not present

## 2023-09-18 DIAGNOSIS — Z0000 Encounter for general adult medical examination without abnormal findings: Secondary | ICD-10-CM | POA: Diagnosis not present

## 2023-09-18 LAB — COMPREHENSIVE METABOLIC PANEL WITH GFR
ALT: 21 U/L (ref 0–35)
AST: 20 U/L (ref 0–37)
Albumin: 4.4 g/dL (ref 3.5–5.2)
Alkaline Phosphatase: 83 U/L (ref 39–117)
BUN: 21 mg/dL (ref 6–23)
CO2: 29 meq/L (ref 19–32)
Calcium: 9.6 mg/dL (ref 8.4–10.5)
Chloride: 102 meq/L (ref 96–112)
Creatinine, Ser: 0.7 mg/dL (ref 0.40–1.20)
GFR: 88.42 mL/min (ref >60.00)
Glucose, Bld: 111 mg/dL — ABNORMAL HIGH (ref 70–99)
Potassium: 4.2 meq/L (ref 3.5–5.1)
Sodium: 137 meq/L (ref 135–145)
Total Bilirubin: 0.6 mg/dL (ref 0.2–1.2)
Total Protein: 7.2 g/dL (ref 6.0–8.3)

## 2023-09-18 LAB — LIPID PANEL
Cholesterol: 186 mg/dL (ref 0–200)
HDL: 60.5 mg/dL (ref >39.00)
LDL Cholesterol: 104 mg/dL — ABNORMAL HIGH (ref 0–99)
NonHDL: 125.11
Total CHOL/HDL Ratio: 3
Triglycerides: 107 mg/dL (ref 0.0–149.0)
VLDL: 21.4 mg/dL (ref 0.0–40.0)

## 2023-09-18 LAB — CBC WITH DIFFERENTIAL/PLATELET
Basophils Absolute: 0 10*3/uL (ref 0.0–0.1)
Basophils Relative: 0.8 % (ref 0.0–3.0)
Eosinophils Absolute: 0.2 10*3/uL (ref 0.0–0.7)
Eosinophils Relative: 3.3 % (ref 0.0–5.0)
HCT: 46.8 % — ABNORMAL HIGH (ref 36.0–46.0)
Hemoglobin: 15.7 g/dL — ABNORMAL HIGH (ref 12.0–15.0)
Lymphocytes Relative: 24 % (ref 12.0–46.0)
Lymphs Abs: 1.2 10*3/uL (ref 0.7–4.0)
MCHC: 33.5 g/dL (ref 30.0–36.0)
MCV: 92.8 fl (ref 78.0–100.0)
Monocytes Absolute: 0.5 10*3/uL (ref 0.1–1.0)
Monocytes Relative: 9.7 % (ref 3.0–12.0)
Neutro Abs: 3 10*3/uL (ref 1.4–7.7)
Neutrophils Relative %: 62.2 % (ref 43.0–77.0)
Platelets: 232 10*3/uL (ref 150.0–400.0)
RBC: 5.04 Mil/uL (ref 3.87–5.11)
RDW: 13.9 % (ref 11.5–15.5)
WBC: 4.8 10*3/uL (ref 4.0–10.5)

## 2023-09-18 LAB — HEMOGLOBIN A1C: Hgb A1c MFr Bld: 5.5 % (ref 4.6–6.5)

## 2023-09-18 NOTE — Progress Notes (Signed)
 Complete physical exam  Patient: Amanda Moss   DOB: [birth year]   68 y.o. Female  MRN: [id number]  Subjective:    Chief Complaint  Patient presents with   Annual Exam    Amanda Moss is a 68 y.o. female who presents today for a complete physical exam. She reports consuming a general diet. Avoids raw onions. Takes vitamin D and preservision supplements. Home exercise routine includes walking 1 hrs per week and yoga. She generally feels well. She reports sleeping well. She does not have additional problems to discuss today.    Most recent fall risk assessment:    09/18/2023    7:58 AM  Fall Risk   Falls in the past year? 0  Number falls in past yr: 0  Injury with Fall? 0  Risk for fall due to : No Fall Risks  Follow up Falls evaluation completed     Most recent depression screenings:    09/18/2023    8:02 AM 01/28/2023    3:53 PM  PHQ 2/9 Scores  PHQ - 2 Score 0 0  PHQ- 9 Score  1    Vision:Within last year and Dental: No current dental problems and Receives regular dental care  Patient Active Problem List   Diagnosis Date Noted   Vitamin D deficiency 02/27/2016   Osteopenia 02/27/2016   Essential hypertension 09/21/2014      Patient Care Team: Karie Georges, MD as PCP - General (Family Medicine) Richarda Overlie, MD as Consulting Physician (Obstetrics and Gynecology)   Outpatient Medications Prior to Visit  Medication Sig   amLODipine (NORVASC) 5 MG tablet TAKE 1 AND 1/2 TABLETS BY MOUTH ONCE DAILY   Cholecalciferol (VITAMIN D PO) Take by mouth.   Multiple Vitamins-Minerals (PRESERVISION AREDS PO) Take 1 tablet by mouth daily.    [DISCONTINUED] clonazePAM (KLONOPIN) 0.5 MG tablet Take 1 tablet (0.5 mg total) by mouth daily as needed for anxiety.   [DISCONTINUED] amoxicillin-clavulanate (AUGMENTIN) 875-125 MG tablet Take 1 tablet by mouth 2 (two) times daily.   No facility-administered medications prior to visit.    Review of Systems  HENT:  Negative for  hearing loss.   Eyes:  Negative for blurred vision.  Respiratory:  Negative for shortness of breath.   Cardiovascular:  Negative for chest pain.  Gastrointestinal: Negative.   Genitourinary: Negative.   Musculoskeletal:  Negative for back pain.  Neurological:  Negative for headaches.  Psychiatric/Behavioral:  Negative for depression.        Objective:     BP 104/70   Pulse 75   Temp 98.2 F (36.8 C) (Oral)   Ht 5' 0.5" (1.537 m)   Wt 131 lb 14.4 oz (59.8 kg)   SpO2 97%   BMI 25.34 kg/m    Physical Exam Vitals reviewed.  Constitutional:      Appearance: Normal appearance. She is well-groomed and normal weight.  HENT:     Right Ear: Tympanic membrane and ear canal normal.     Left Ear: Tympanic membrane and ear canal normal.     Mouth/Throat:     Mouth: Mucous membranes are moist.     Pharynx: No posterior oropharyngeal erythema.  Eyes:     Conjunctiva/sclera: Conjunctivae normal.  Neck:     Thyroid: No thyromegaly.  Cardiovascular:     Rate and Rhythm: Normal rate and regular rhythm.     Pulses: Normal pulses.     Heart sounds: S1 normal and S2 normal.  Pulmonary:  Effort: Pulmonary effort is normal.     Breath sounds: Normal breath sounds and air entry.  Abdominal:     General: Abdomen is flat. Bowel sounds are normal.     Palpations: Abdomen is soft.  Musculoskeletal:     Right lower leg: No edema.     Left lower leg: No edema.  Lymphadenopathy:     Cervical: No cervical adenopathy.  Neurological:     Mental Status: She is alert and oriented to person, place, and time. Mental status is at baseline.     Gait: Gait is intact.  Psychiatric:        Mood and Affect: Mood and affect normal.        Speech: Speech normal.        Behavior: Behavior normal.        Judgment: Judgment normal.     No results found for any visits on 09/18/23.     Assessment & Plan:    Routine Health Maintenance and Physical Exam  Immunization History  Administered  Date(s) Administered   Influenza-Unspecified 03/17/2014, 03/18/2015, 04/17/2018   Moderna Covid-19 Vaccine Bivalent Booster 12yrs & up 03/05/2023   PFIZER(Purple Top)SARS-COV-2 Vaccination 07/31/2019, 08/25/2019, 03/17/2020   PNEUMOCOCCAL CONJUGATE-20 09/06/2020   Tdap 03/02/2014   Zoster Recombinant(Shingrix) 08/20/2021, 11/11/2021   Zoster, Live 09/21/2014    Health Maintenance  Topic Date Due   MAMMOGRAM  02/28/2023   COVID-19 Vaccine (5 - 2024-25 season) 04/30/2023   Medicare Annual Wellness (AWV)  12/27/2023   Lung Cancer Screening  12/30/2023   INFLUENZA VACCINE  01/16/2024   DTaP/Tdap/Td (2 - Td or Tdap) 03/02/2024   Colonoscopy  07/09/2028   Pneumonia Vaccine 71+ Years old  Completed   DEXA SCAN  Completed   Hepatitis C Screening  Completed   Zoster Vaccines- Shingrix  Completed   HPV VACCINES  Aged Out    Discussed health benefits of physical activity, and encouraged her to engage in regular exercise appropriate for her age and condition.  Essential hypertension -     Comprehensive metabolic panel with GFR; Future -     CBC with Differential/Platelet; Future  Hyperglycemia -     Hemoglobin A1c; Future  Need for lipid screening -     Lipid panel; Future  Routine general medical examination at a health care facility  Breast cancer screening by mammogram -     3D Screening Mammogram, Left and Right; Future  History of tobacco use -     CT CHEST LUNG CANCER SCREENING LOW DOSE WO CONTRAST; Future  Normal physical exam findings today, I reviewed her health maintenance and orders were placed. I counseled the patient on bone density testing today however pt declined to have this done. She is agreeable to the rest of the testing. Labs ordered for annual surveillance, health maintenance measures reviewed.   Return in 1 year (on 09/17/2024).     Karie Georges, MD

## 2023-09-18 NOTE — Patient Instructions (Signed)

## 2023-09-19 ENCOUNTER — Encounter: Payer: Self-pay | Admitting: Family Medicine

## 2023-10-14 ENCOUNTER — Ambulatory Visit
Admission: RE | Admit: 2023-10-14 | Discharge: 2023-10-14 | Disposition: A | Discharge status: Home / Self Care | Source: Ambulatory Visit | Attending: Family Medicine | Admitting: Family Medicine

## 2023-10-14 DIAGNOSIS — Z1231 Encounter for screening mammogram for malignant neoplasm of breast: Secondary | ICD-10-CM | POA: Diagnosis not present

## 2023-10-16 ENCOUNTER — Encounter: Payer: Self-pay | Admitting: Family Medicine

## 2023-10-31 DIAGNOSIS — H2513 Age-related nuclear cataract, bilateral: Secondary | ICD-10-CM | POA: Diagnosis not present

## 2023-10-31 DIAGNOSIS — H43813 Vitreous degeneration, bilateral: Secondary | ICD-10-CM | POA: Diagnosis not present

## 2023-10-31 DIAGNOSIS — H353131 Nonexudative age-related macular degeneration, bilateral, early dry stage: Secondary | ICD-10-CM | POA: Diagnosis not present

## 2023-12-12 ENCOUNTER — Other Ambulatory Visit: Payer: Self-pay | Admitting: Family Medicine

## 2023-12-22 DIAGNOSIS — R0981 Nasal congestion: Secondary | ICD-10-CM | POA: Diagnosis not present

## 2023-12-22 DIAGNOSIS — C44321 Squamous cell carcinoma of skin of nose: Secondary | ICD-10-CM | POA: Diagnosis not present

## 2024-01-13 DIAGNOSIS — D2261 Melanocytic nevi of right upper limb, including shoulder: Secondary | ICD-10-CM | POA: Diagnosis not present

## 2024-01-13 DIAGNOSIS — D1801 Hemangioma of skin and subcutaneous tissue: Secondary | ICD-10-CM | POA: Diagnosis not present

## 2024-01-13 DIAGNOSIS — L814 Other melanin hyperpigmentation: Secondary | ICD-10-CM | POA: Diagnosis not present

## 2024-01-13 DIAGNOSIS — L821 Other seborrheic keratosis: Secondary | ICD-10-CM | POA: Diagnosis not present

## 2024-01-13 DIAGNOSIS — Z85828 Personal history of other malignant neoplasm of skin: Secondary | ICD-10-CM | POA: Diagnosis not present

## 2024-01-13 DIAGNOSIS — D225 Melanocytic nevi of trunk: Secondary | ICD-10-CM | POA: Diagnosis not present

## 2024-01-13 DIAGNOSIS — D2272 Melanocytic nevi of left lower limb, including hip: Secondary | ICD-10-CM | POA: Diagnosis not present

## 2024-01-13 DIAGNOSIS — L820 Inflamed seborrheic keratosis: Secondary | ICD-10-CM | POA: Diagnosis not present

## 2024-01-13 DIAGNOSIS — L905 Scar conditions and fibrosis of skin: Secondary | ICD-10-CM | POA: Diagnosis not present

## 2024-02-12 ENCOUNTER — Ambulatory Visit: Payer: Self-pay

## 2024-02-12 NOTE — Telephone Encounter (Signed)
 Noted- ok to close.

## 2024-02-12 NOTE — Telephone Encounter (Signed)
 FYI Only or Action Required?: FYI only for provider.  Patient was last seen in primary care on 09/18/2023 by Ozell Heron HERO, MD.  Called Nurse Triage reporting Dizziness.  Symptoms began a week ago.  Interventions attempted: Rest, hydration, or home remedies.  Symptoms are: gradually worsening.  Triage Disposition: See Physician Within 24 Hours  Patient/caregiver understands and will follow disposition?: Yes         Copied from CRM #[id number]. Topic: Clinical - Red Word Triage >> Feb 12, 2024  8:18 AM Adelita E wrote: [facility] that prompted transfer to Nurse Triage: Light-headedness along with room spinning, right ear itching. Ear itching for one week, spinning room symptoms for a few days. Reason for Disposition  [1] MODERATE dizziness (e.g., vertigo; feels very unsteady, interferes with normal activities) AND [2] has NOT been evaluated by doctor (or NP/PA) for this  Answer Assessment - Initial Assessment Questions 1. DESCRIPTION: Describe your dizziness.     Feels like room is spinning 2. VERTIGO: Do you feel like either you or the room is spinning or tilting?      Yes 5. ONSET:  When did the dizziness begin?     X 1 month, only with excercise 6. AGGRAVATING FACTORS: Does anything make it worse? (e.g., standing, change in head position)     Exercise, change in position 7. CAUSE: What do you think is causing the dizziness?     Unknown 8. RECURRENT SYMPTOM: Have you had dizziness before? If Yes, ask: When was the last time? What happened that time?     No 9. OTHER SYMPTOMS: Do you have any other symptoms? (e.g., earache, headache, numbness, tinnitus, vomiting, weakness)     Right ear itching x 1 week  Protocols used: Dizziness - Vertigo-A-AH

## 2024-02-12 NOTE — Telephone Encounter (Signed)
 Appt was scheduled with Dr Micheal on  8/29

## 2024-02-13 ENCOUNTER — Encounter: Payer: Self-pay | Admitting: Family Medicine

## 2024-02-13 ENCOUNTER — Ambulatory Visit: Admitting: Family Medicine

## 2024-02-13 VITALS — BP 138/76 | HR 86 | Temp 97.9°F | Wt 133.3 lb

## 2024-02-13 DIAGNOSIS — H8111 Benign paroxysmal vertigo, right ear: Secondary | ICD-10-CM

## 2024-02-13 DIAGNOSIS — L299 Pruritus, unspecified: Secondary | ICD-10-CM | POA: Diagnosis not present

## 2024-02-13 MED ORDER — MOMETASONE FUROATE 0.1 % EX SOLN: Freq: Every day | CUTANEOUS | 0 refills | Status: AC

## 2024-02-13 NOTE — Progress Notes (Signed)
 Established Patient Office Visit  Subjective   Patient ID: Amanda Moss, female    DOB: 1955-09-27  Age: 68 y.o. MRN: [id number]  Chief Complaint  Patient presents with   Dizziness    HPI   Amanda Moss is seen today with vertigo type symptoms which started about 4 days ago.  She first noticed this when she was in a yoga class and turned to the right side.  She states she felt like the room was spinning .  Subsequently she has noticed this when turning over in bed.  No nausea or vomiting.  No focal weakness.  No headaches.  No confusion.  No ataxia.  No dysphagia.  Does recall previous episode of vertigo around 1995 but none since then.  No chest pains or shortness of breath.  She was particularly concerned because she is going on a cruise September 6  She also relates pruritus right ear canal past several days.  No drainage.  No recent tinnitus.  No acute hearing changes.  Past Medical History:  Diagnosis Date   Bleeding 2003   hemorrhoidal bleeding   Hypertension    Squamous cell cancer of skin of nose    per patient treated by Dr Ethyl SERMONS PAIN, RIGHT 06/20/2010   Qualifier: Diagnosis of  By: Eyvonne MD, Debby ORN     Tobacco use    Tobacco use disorder 09/21/2014   Vitamin D  deficiency    Past Surgical History:  Procedure Laterality Date   CESAREAN SECTION     COSMETIC SURGERY  2001   eyes   NASAL RECONSTRUCTION     RHINOPLASTY     SQUAMOUS CELL CARCINOMA EXCISION     on nose; require reconstruction    reports that she quit smoking about 5 years ago. Her smoking use included cigarettes. She started smoking about 45 years ago. She has a 20 pack-year smoking history. She has never used smokeless tobacco. She reports current alcohol use. She reports that she does not use drugs. family history includes Dementia in her mother; Hypertension in her father; Lung cancer (age of onset: 49) in her father; Pneumonia (age of onset: 33) in her father; Prostate cancer in her  brother. Allergies  Allergen Reactions   Codeine Sulfate     REACTION: nausea    Review of Systems  Constitutional:  Negative for chills and fever.  HENT:  Negative for congestion, ear pain, hearing loss and tinnitus.   Respiratory:  Negative for shortness of breath.   Cardiovascular:  Negative for chest pain.  Neurological:  Positive for dizziness. Negative for tremors, speech change, focal weakness, seizures, loss of consciousness, weakness and headaches.      Objective:     BP 138/76   Pulse 86   Temp 97.9 F (36.6 C) (Oral)   Wt 133 lb 4.8 oz (60.5 kg)   SpO2 96%   BMI 25.60 kg/m  BP Readings from Last 3 Encounters:  02/13/24 138/76  09/18/23 104/70  06/16/23 120/82   Wt Readings from Last 3 Encounters:  02/13/24 133 lb 4.8 oz (60.5 kg)  09/18/23 131 lb 14.4 oz (59.8 kg)  01/28/23 130 lb 9.6 oz (59.2 kg)      Physical Exam Vitals reviewed.  Constitutional:      General: She is not in acute distress.    Appearance: She is not ill-appearing.  HENT:     Right Ear: Tympanic membrane normal.     Left Ear: Tympanic membrane normal.  Ears:     Comments: Right ear canal reveals some minimal dryness and minimal scaling.  No signs of infection.  Eardrum appears normal Cardiovascular:     Rate and Rhythm: Normal rate and regular rhythm.  Pulmonary:     Effort: Pulmonary effort is normal.     Breath sounds: Normal breath sounds. No wheezing or rales.  Musculoskeletal:     Cervical back: Neck supple.  Neurological:     General: No focal deficit present.     Mental Status: She is alert and oriented to person, place, and time.     Cranial Nerves: No cranial nerve deficit.     Motor: No weakness.     Coordination: Coordination normal.     Gait: Gait normal.     Comments: She does have reproducible vertigo symptoms when going from seated to supine with head turned 45 weeks of the right.  Symptoms were transient and cleared within several seconds.      No  results found for any visits on 02/13/24.    The 10-year ASCVD risk score (Arnett DK, et al., 2019) is: 12.5%    Assessment & Plan:   #1 vertigo.  Suspect benign peripheral positional vertigo to the right.  Discussed limited role of medications in treating this.  We discussed Epley maneuvers with handout given.  Discussed warning symptoms for more worrisome vertigo.  Be in touch early next week if symptoms not clearing or improving with Epley maneuvers  #2 pruritus right ear canal.  Mometasone  lotion 2 drops right ear canal once daily as needed  Wolm Scarlet, MD

## 2024-06-06 ENCOUNTER — Other Ambulatory Visit: Payer: Self-pay | Admitting: Family Medicine

## 2024-06-07 DIAGNOSIS — Z1151 Encounter for screening for human papillomavirus (HPV): Secondary | ICD-10-CM | POA: Diagnosis not present

## 2024-06-07 DIAGNOSIS — Z124 Encounter for screening for malignant neoplasm of cervix: Secondary | ICD-10-CM | POA: Diagnosis not present

## 2024-08-16 ENCOUNTER — Ambulatory Visit
